# Patient Record
Sex: Female | Born: 1970 | Race: Black or African American | Hispanic: No | Marital: Single | Smoking: Current every day smoker
Health system: Southern US, Community
[De-identification: ages and names within clinical notes are randomized; demographics above are authoritative.]

## PROBLEM LIST (undated history)

## (undated) DIAGNOSIS — I10 Essential (primary) hypertension: Secondary | ICD-10-CM

## (undated) DIAGNOSIS — R51 Headache: Secondary | ICD-10-CM

## (undated) DIAGNOSIS — F329 Major depressive disorder, single episode, unspecified: Secondary | ICD-10-CM

## (undated) DIAGNOSIS — F419 Anxiety disorder, unspecified: Secondary | ICD-10-CM

## (undated) DIAGNOSIS — F32A Depression, unspecified: Secondary | ICD-10-CM

## (undated) DIAGNOSIS — R519 Headache, unspecified: Secondary | ICD-10-CM

## (undated) DIAGNOSIS — R42 Dizziness and giddiness: Secondary | ICD-10-CM

## (undated) HISTORY — DX: Headache: R51

## (undated) HISTORY — PX: SMALL INTESTINE SURGERY: SHX150

## (undated) HISTORY — DX: Headache, unspecified: R51.9

---

## 2004-02-17 ENCOUNTER — Emergency Department (HOSPITAL_COMMUNITY): Admission: EM | Admit: 2004-02-17 | Discharge: 2004-02-17 | Payer: Self-pay | Admitting: Emergency Medicine

## 2004-11-25 ENCOUNTER — Emergency Department (HOSPITAL_COMMUNITY): Admission: EM | Admit: 2004-11-25 | Discharge: 2004-11-25 | Payer: Self-pay | Admitting: Emergency Medicine

## 2011-03-01 ENCOUNTER — Emergency Department (HOSPITAL_COMMUNITY)
Admission: EM | Admit: 2011-03-01 | Discharge: 2011-03-01 | Disposition: A | Discharge status: Home / Self Care | Payer: Medicare Other | Attending: Emergency Medicine | Admitting: Emergency Medicine

## 2011-03-01 ENCOUNTER — Encounter: Payer: Self-pay | Admitting: *Deleted

## 2011-03-01 ENCOUNTER — Emergency Department (HOSPITAL_COMMUNITY): Payer: Medicare Other

## 2011-03-01 DIAGNOSIS — R296 Repeated falls: Secondary | ICD-10-CM | POA: Insufficient documentation

## 2011-03-01 DIAGNOSIS — I10 Essential (primary) hypertension: Secondary | ICD-10-CM | POA: Insufficient documentation

## 2011-03-01 DIAGNOSIS — M7989 Other specified soft tissue disorders: Secondary | ICD-10-CM

## 2011-03-01 DIAGNOSIS — S82302A Unspecified fracture of lower end of left tibia, initial encounter for closed fracture: Secondary | ICD-10-CM

## 2011-03-01 DIAGNOSIS — S8253XA Displaced fracture of medial malleolus of unspecified tibia, initial encounter for closed fracture: Secondary | ICD-10-CM | POA: Insufficient documentation

## 2011-03-01 DIAGNOSIS — F17200 Nicotine dependence, unspecified, uncomplicated: Secondary | ICD-10-CM | POA: Insufficient documentation

## 2011-03-01 HISTORY — DX: Anxiety disorder, unspecified: F41.9

## 2011-03-01 HISTORY — DX: Major depressive disorder, single episode, unspecified: F32.9

## 2011-03-01 HISTORY — DX: Depression, unspecified: F32.A

## 2011-03-01 HISTORY — DX: Essential (primary) hypertension: I10

## 2011-03-01 HISTORY — DX: Dizziness and giddiness: R42

## 2011-03-01 LAB — COMPREHENSIVE METABOLIC PANEL
ALT: 13 U/L (ref 0–35)
Albumin: 3.4 g/dL — ABNORMAL LOW (ref 3.5–5.2)
BUN: 7 mg/dL (ref 6–23)
Calcium: 8.7 mg/dL (ref 8.4–10.5)
GFR calc Af Amer: >60 mL/min (ref >60)
Glucose, Bld: 96 mg/dL (ref 70–99)
Sodium: 138 mEq/L (ref 135–145)
Total Protein: 7.2 g/dL (ref 6.0–8.3)

## 2011-03-01 LAB — URINALYSIS, ROUTINE W REFLEX MICROSCOPIC
Nitrite: NEGATIVE
Specific Gravity, Urine: 1.02 (ref 1.005–1.030)
pH: 6.5 (ref 5.0–8.0)

## 2011-03-01 LAB — URINE MICROSCOPIC-ADD ON

## 2011-03-01 MED ORDER — KETOROLAC TROMETHAMINE 60 MG/2ML IM SOLN
60.0000 mg | Freq: Once | INTRAMUSCULAR | Status: AC
  Administered 2011-03-01: 60 mg via INTRAMUSCULAR
  Filled 2011-03-01: qty 2

## 2011-03-01 MED ORDER — IBUPROFEN 800 MG PO TABS: 800.0000 mg | ORAL_TABLET | Freq: Three times a day (TID) | ORAL | Status: AC

## 2011-03-01 MED ORDER — ONDANSETRON 4 MG PO TBDP
ORAL_TABLET | ORAL | Status: AC
  Administered 2011-03-01: 4 mg
  Filled 2011-03-01: qty 1

## 2011-03-01 MED ORDER — KETOROLAC TROMETHAMINE 30 MG/ML IJ SOLN: 30.0000 mg | Freq: Once | INTRAMUSCULAR | Status: DC

## 2011-03-01 MED ORDER — ONDANSETRON 4 MG PO TBDP: 4.0000 mg | ORAL_TABLET | Freq: Three times a day (TID) | ORAL | Status: AC | PRN

## 2011-03-01 NOTE — ED Provider Notes (Signed)
History     CSN: [id number] Arrival date & time: 03/01/2011  6:11 PM  Chief Complaint  Patient presents with  . Foot Swelling   HPI Comments: Patient presents to the emergency department as a 40 year old female with a history of hypertension with 2 weeks of ankle swelling. She states that this was gradual in onset and gradually worsened and last night fell when she twisted her left ankle. Her left ankle has thus been more swollen and tender since that time. She denies shortness of breath, dyspnea with exertion, chest pain, abdominal pain or swelling of any other part of her body. The symptoms are gradually getting worse, nothing makes better, worse with trauma. He has no fevers or coughs. She has no history of congestive heart failure, kidney or liver failure. She denies excessive alcohol use or excessive Tylenol use. She states that her family Dr. has recently discontinued her blood pressure medication and she is unsure why.  The history is provided by the patient.    Past Medical History  Diagnosis Date  . Hypertension   . Depression   . Anxiety   . Vertigo     Past Surgical History  Procedure Date  . Small intestine surgery     No family history on file.  History  Substance Use Topics  . Smoking status: Current Everyday Smoker  . Smokeless tobacco: Not on file  . Alcohol Use: No    OB History    Grav Para Term Preterm Abortions TAB SAB Ect Mult Living                  Review of Systems  All other systems reviewed and are negative.    Physical Exam  BP 122/86  Pulse 85  Temp(Src) 98.3 F (36.8 C) (Oral)  Resp 20  Ht 5\' 3"  (1.6 m)  Wt 180 lb 7 oz (81.846 kg)  BMI 31.96 kg/m2  SpO2 100%  LMP 03/01/2011  Physical Exam  Nursing note and vitals reviewed. Constitutional: She appears well-developed and well-nourished. No distress.  HENT:  Head: Normocephalic and atraumatic.  Mouth/Throat: Oropharynx is clear and moist. No oropharyngeal exudate.  Eyes:  Conjunctivae and EOM are normal. Pupils are equal, round, and reactive to light. Right eye exhibits no discharge. Left eye exhibits no discharge. No scleral icterus.  Neck: Normal range of motion. Neck supple. No JVD present. No thyromegaly present.  Cardiovascular: Normal rate, regular rhythm, normal heart sounds and intact distal pulses.  Exam reveals no gallop and no friction rub.   No murmur heard. Pulmonary/Chest: Effort normal and breath sounds normal. No respiratory distress. She has no wheezes. She has no rales.  Abdominal: Soft. Bowel sounds are normal. She exhibits no distension and no mass. There is no tenderness.  Musculoskeletal: Normal range of motion. She exhibits edema (mild right lower extremity edema, significant left lower extremity edema) and tenderness (Tender to palpation over the left ankle with decreased range of motion secondary to tenderness.).  Lymphadenopathy:    She has no cervical adenopathy.  Neurological: She is alert. Coordination normal.  Skin: Skin is warm and dry. No rash noted. No erythema.  Psychiatric: She has a normal mood and affect. Her behavior is normal.    ED Course  SPLINT APPLICATION Date/Time: 03/01/2011 7:46 PM Performed by: Eber Hong D Authorized by: Eber Hong D Consent: Verbal consent obtained. Consent given by: patient Imaging studies: imaging studies available Patient identity confirmed: verbally with patient Splint type: ankle stirrup Supplies used:  Ortho-Glass, cotton padding and elastic bandage Post-procedure: The splinted body part was neurovascularly unchanged following the procedure. Patient tolerance: Patient tolerated the procedure well with no immediate complications.    MDM Patient has bilateral lower extremity edema left greater than right however she has injured this left ankle as of last night. According to the x-ray report she has a nondisplaced fracture of the medial malleolus of the left ankle. Will immobilize  ice and elevate and have her followup with orthopedics. Also rule out other sources of swelling such as kidney disease with proteinuria and hypoalbuminemia. Due to the bilateral edema of the lower extremities and no other significant risk factors for DVT we'll forego this testing today. She can followup with her family doctor if she continues to have swelling despite a source. I have communicated these findings with the patient and agrees to follow up as indicated.    X-rays reviewed, splint placed, laboratory workup reveals no source for her swelling. Advised patient on further workup if swelling doesn't go down. Fracture explains excessive swelling of the left ankle.  Vida Roller, MD 03/01/11 2020

## 2011-03-01 NOTE — ED Notes (Signed)
Patient now states she has diarrhea

## 2011-03-01 NOTE — ED Notes (Signed)
C/o swelling to bil feet/ankles x 1 wk.  States fell x 2 days ago and hurt left ankle.  C/o pain to both ankles.

## 2011-03-01 NOTE — ED Notes (Signed)
C/o bil ankle pain. Slight swelling noted.

## 2012-02-29 ENCOUNTER — Emergency Department (HOSPITAL_COMMUNITY)
Admission: EM | Admit: 2012-02-29 | Discharge: 2012-02-29 | Disposition: A | Discharge status: Home / Self Care | Payer: Medicare Other | Attending: Emergency Medicine | Admitting: Emergency Medicine

## 2012-02-29 ENCOUNTER — Encounter (HOSPITAL_COMMUNITY): Payer: Self-pay | Admitting: Emergency Medicine

## 2012-02-29 DIAGNOSIS — F17200 Nicotine dependence, unspecified, uncomplicated: Secondary | ICD-10-CM | POA: Insufficient documentation

## 2012-02-29 DIAGNOSIS — Z760 Encounter for issue of repeat prescription: Secondary | ICD-10-CM | POA: Insufficient documentation

## 2012-02-29 DIAGNOSIS — R51 Headache: Secondary | ICD-10-CM | POA: Insufficient documentation

## 2012-02-29 DIAGNOSIS — I10 Essential (primary) hypertension: Secondary | ICD-10-CM | POA: Insufficient documentation

## 2012-02-29 MED ORDER — ALPRAZOLAM 1 MG PO TABS: ORAL_TABLET | ORAL | Status: DC

## 2012-02-29 NOTE — ED Notes (Signed)
Pt is tearful and anxious, states that she is seen at a mental health place but lost her prescription for xanax and her MD would not re-write it resulting in her being out of this medication and increased anxiety from this.  Pt has a HA which she attributes to stress which she has had "all her life".  Pt tearfully describes multiple life events which have been upsetting to her (none are very recent).  Pt denies any SI or HI.  NO neuro deficits from this HA

## 2012-02-29 NOTE — ED Notes (Signed)
[facility] contacted and list of home medications requested. Pharmacy to send copy as soon as possible.

## 2012-02-29 NOTE — ED Provider Notes (Signed)
Medical screening examination/treatment/procedure(s) were performed by non-physician practitioner and as supervising physician I was immediately available for consultation/collaboration.  Cierrah Dace, MD 02/29/12 1520 
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
## 2012-02-29 NOTE — ED Notes (Signed)
Pt c/o headache all her life. Pt was seen by PCP and [facility] for the same and they would not give her anything for pain.

## 2013-05-17 ENCOUNTER — Other Ambulatory Visit: Payer: Self-pay

## 2013-05-17 DIAGNOSIS — G4730 Sleep apnea, unspecified: Secondary | ICD-10-CM

## 2014-06-02 ENCOUNTER — Ambulatory Visit (INDEPENDENT_AMBULATORY_CARE_PROVIDER_SITE_OTHER): Payer: Self-pay | Admitting: Psychiatry

## 2014-06-02 ENCOUNTER — Emergency Department (HOSPITAL_COMMUNITY)
Admission: EM | Admit: 2014-06-02 | Discharge: 2014-06-02 | Disposition: A | Discharge status: Home / Self Care | Payer: Medicare Other | Attending: Emergency Medicine | Admitting: Emergency Medicine

## 2014-06-02 ENCOUNTER — Encounter (HOSPITAL_COMMUNITY): Payer: Self-pay | Admitting: Psychiatry

## 2014-06-02 ENCOUNTER — Encounter (HOSPITAL_COMMUNITY): Payer: Self-pay | Admitting: *Deleted

## 2014-06-02 VITALS — BP 106/80 | HR 86 | Ht 63.0 in | Wt 190.0 lb

## 2014-06-02 DIAGNOSIS — R51 Headache: Secondary | ICD-10-CM | POA: Diagnosis present

## 2014-06-02 DIAGNOSIS — K006 Disturbances in tooth eruption: Secondary | ICD-10-CM | POA: Insufficient documentation

## 2014-06-02 DIAGNOSIS — R110 Nausea: Secondary | ICD-10-CM | POA: Insufficient documentation

## 2014-06-02 DIAGNOSIS — I10 Essential (primary) hypertension: Secondary | ICD-10-CM | POA: Diagnosis not present

## 2014-06-02 DIAGNOSIS — F32A Depression, unspecified: Secondary | ICD-10-CM | POA: Insufficient documentation

## 2014-06-02 DIAGNOSIS — K0381 Cracked tooth: Secondary | ICD-10-CM | POA: Diagnosis not present

## 2014-06-02 DIAGNOSIS — Z720 Tobacco use: Secondary | ICD-10-CM | POA: Insufficient documentation

## 2014-06-02 DIAGNOSIS — F419 Anxiety disorder, unspecified: Secondary | ICD-10-CM | POA: Diagnosis not present

## 2014-06-02 DIAGNOSIS — F329 Major depressive disorder, single episode, unspecified: Secondary | ICD-10-CM | POA: Diagnosis not present

## 2014-06-02 DIAGNOSIS — R519 Headache, unspecified: Secondary | ICD-10-CM

## 2014-06-02 DIAGNOSIS — Z79899 Other long term (current) drug therapy: Secondary | ICD-10-CM | POA: Insufficient documentation

## 2014-06-02 MED ORDER — DIPHENHYDRAMINE HCL 50 MG/ML IJ SOLN
25.0000 mg | Freq: Once | INTRAMUSCULAR | Status: AC
  Administered 2014-06-02: 25 mg via INTRAMUSCULAR
  Filled 2014-06-02: qty 1

## 2014-06-02 MED ORDER — FLUOXETINE HCL 40 MG PO CAPS: 40.0000 mg | ORAL_CAPSULE | Freq: Every day | ORAL | Status: DC

## 2014-06-02 MED ORDER — KETOROLAC TROMETHAMINE 60 MG/2ML IM SOLN
60.0000 mg | Freq: Once | INTRAMUSCULAR | Status: AC
  Administered 2014-06-02: 60 mg via INTRAMUSCULAR
  Filled 2014-06-02: qty 2

## 2014-06-02 MED ORDER — METOCLOPRAMIDE HCL 5 MG/ML IJ SOLN
10.0000 mg | Freq: Once | INTRAMUSCULAR | Status: AC
  Administered 2014-06-02: 10 mg via INTRAMUSCULAR
  Filled 2014-06-02: qty 2

## 2014-06-02 MED ORDER — TRAZODONE HCL 100 MG PO TABS: 100.0000 mg | ORAL_TABLET | Freq: Every day | ORAL | Status: DC

## 2014-06-02 NOTE — ED Provider Notes (Signed)
CSN: [id number]     Arrival date & time 06/02/14  1118 History   First MD Initiated Contact with Patient 06/02/14 1204     Chief Complaint  Patient presents with  . Headache     (Consider location/radiation/quality/duration/timing/severity/associated sxs/prior Treatment) HPI Comments: Pt comes in with headache times a couple of day. Pt has history of headache. She has taken multiple otc medications without relief. Has had nausea no vomiting. Pt states that she thinks it was triggered from her toothache and having her period. Denies blurred vision, headache, syncope or neck pain. Pt is on pcn for toothache  The history is provided by the patient. No language interpreter was used.    Past Medical History  Diagnosis Date  . Hypertension   . Depression   . Anxiety   . Vertigo   . Headache    Past Surgical History  Procedure Laterality Date  . Small intestine surgery     History reviewed. No pertinent family history. History  Substance Use Topics  . Smoking status: Current Every Day Smoker  . Smokeless tobacco: Not on file  . Alcohol Use: Yes     Comment: Occa.   OB History    No data available     Review of Systems  All other systems reviewed and are negative.     Allergies  Review of patient's allergies indicates no known allergies.  Home Medications   Prior to Admission medications   Medication Sig Start Date End Date Taking? Authorizing Provider  clonazePAM (KLONOPIN) 0.5 MG tablet Take 1 tablet by mouth at bedtime as needed for anxiety.  05/24/14  Yes Historical Provider, MD  FLUoxetine (PROZAC) 40 MG capsule Take 1 capsule (40 mg total) by mouth daily. 06/02/14  Yes Levonne Spiller, MD  HYDROcodone-acetaminophen (NORCO/VICODIN) 5-325 MG per tablet Take 1 tablet by mouth 2 (two) times daily as needed for moderate pain.  05/25/14  Yes Historical Provider, MD  ibuprofen (ADVIL,MOTRIN) 200 MG tablet Take 400 mg by mouth daily as needed. For pain   Yes Historical  Provider, MD  meclizine (ANTIVERT) 25 MG tablet Take 25 mg by mouth 3 (three) times daily as needed for dizziness.  05/24/14  Yes Historical Provider, MD  ondansetron (ZOFRAN) 4 MG tablet Take 4 mg by mouth every 8 (eight) hours as needed for nausea or vomiting.  05/30/14  Yes Historical Provider, MD  risperiDONE (RISPERDAL) 1 MG tablet Take 1 tablet by mouth at bedtime. 05/20/14  Yes Historical Provider, MD  traZODone (DESYREL) 100 MG tablet Take 1 tablet (100 mg total) by mouth at bedtime. 06/02/14  Yes Levonne Spiller, MD  cyclobenzaprine (FLEXERIL) 10 MG tablet Take 10 mg by mouth 3 (three) times daily as needed. For muscles. Filled July 26th    Historical Provider, MD   BP 105/74 mmHg  Pulse 79  Temp(Src) 98.1 F (36.7 C) (Oral)  Resp 18  Ht 5\' 3"  (1.6 m)  Wt 198 lb (89.812 kg)  BMI 35.08 kg/m2  SpO2 100%  LMP 05/29/2014 Physical Exam  Constitutional: She is oriented to person, place, and time. She appears well-developed and well-nourished.  HENT:  Right Ear: External ear normal.  Left Ear: External ear normal.  Multiple decayed and broken teeth  Eyes: Conjunctivae and EOM are normal. Pupils are equal, round, and reactive to light.  Neck: Normal range of motion. Neck supple.  Cardiovascular: Normal rate and regular rhythm.   Pulmonary/Chest: Effort normal and breath sounds normal.  Musculoskeletal: Normal range of  motion.  Neurological: She is oriented to person, place, and time.  Skin: Skin is warm and dry.  Psychiatric: She has a normal mood and affect.  Nursing note and vitals reviewed.   ED Course  Procedures (including critical care time) Labs Review Labs Reviewed - No data to display  Imaging Review No results found.   EKG Interpretation None      MDM   Final diagnoses:  Headache, unspecified headache type    Pt is feeling better after medication. No red flag symptoms    Glendell Docker, NP 06/02/14 1349  Ezequiel Essex, MD 06/02/14 1505

## 2014-06-02 NOTE — ED Notes (Signed)
Patient received Tordol, unable to scan because the computer in room 23 froze and has the medication locked and will not let me document on it.

## 2014-06-02 NOTE — ED Notes (Signed)
Headache for 2 days, with dental pain ,rt lower tooth for "long time"  No HI alert,  Nausea, no vomiting.

## 2014-06-02 NOTE — Care Management Note (Signed)
CM noted pt has no PCP. Patient states her PCP is Dr Woody Seller in [place]. Information entered into EPIC to show PCP as Dr Woody Seller.
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
## 2014-06-02 NOTE — Discharge Instructions (Signed)

## 2014-06-20 ENCOUNTER — Telehealth (HOSPITAL_COMMUNITY): Payer: Self-pay | Admitting: *Deleted

## 2014-06-21 ENCOUNTER — Telehealth (HOSPITAL_COMMUNITY): Payer: Self-pay | Admitting: *Deleted

## 2014-06-21 ENCOUNTER — Other Ambulatory Visit (HOSPITAL_COMMUNITY): Payer: Self-pay | Admitting: Psychiatry

## 2014-06-21 NOTE — Telephone Encounter (Signed)
She obtained that medication from another MD. Let her know I can't refill it

## 2014-06-21 NOTE — Telephone Encounter (Signed)
Pt calling stating she need refills for her Clonazapam 0.5mg  BID. Pt states was last seen 06/02/14. Medication was not refilled at that time. Pt have f/u appt scheduled for 06-30-14. Called pt pharmacy and spoke with [facility] and she stated pt pick up last refill 05-24-14 but the sig was different. The script pt picked up was 05-24-14 was written for once a day not twice a day. Pt phone number is [phone].

## 2014-06-21 NOTE — Telephone Encounter (Signed)
She will not be able to obtain controlled drugs from this office as her drug screen shows evidence of xanax, temazepam(not prescribed) and cocaine. Please make her aware of this

## 2014-06-21 NOTE — Telephone Encounter (Signed)
Per pt she do not see that doctor anymore since she started coming to see Dr. Harrington Challenger. Per pt, Dr. Jonne Ply (From [facility]) informed her he will only give her enough until her appt with Dr. Harrington Challenger which was 06-02-14 . Pt number is [phone].

## 2014-06-22 NOTE — Telephone Encounter (Signed)
Called pt (06-21-14) to inform her of Dr. Harrington Challenger decision. Per pt she takes Xanax and Dr. Harrington Challenger knows about and and she do not take Temazepam and do not know why her urine test came back stating she takes Temazepam. Per pt she do not use crack and uses marijuana every now and then but not crack. Asked pt if she informed Dr. Harrington Challenger about her being on Xanax and pt states she told Dr. Harrington Challenger about it before. Pt later stated she if Dr. Harrington Challenger is unable to refill her medications she will not be coming back to see her anymore. Offered pt if she would like to speak with Dr. Harrington Challenger about her Urine test and pt agreed to talk to her. After pt conversation with Dr. Harrington Challenger, per Dr. Harrington Challenger, pt informed her that she will no longer be coming to our office and voluntarily discharged herself from practice.

## 2014-06-27 ENCOUNTER — Encounter (HOSPITAL_COMMUNITY): Payer: Self-pay | Admitting: *Deleted

## 2014-06-30 ENCOUNTER — Ambulatory Visit (HOSPITAL_COMMUNITY): Payer: Self-pay | Admitting: Psychiatry

## 2014-07-06 ENCOUNTER — Ambulatory Visit (HOSPITAL_COMMUNITY): Payer: Self-pay | Admitting: Psychiatry

## 2014-08-08 ENCOUNTER — Other Ambulatory Visit (HOSPITAL_COMMUNITY): Payer: Self-pay | Admitting: Respiratory Therapy

## 2014-08-08 DIAGNOSIS — G4730 Sleep apnea, unspecified: Secondary | ICD-10-CM

## 2014-10-19 ENCOUNTER — Encounter: Payer: Self-pay | Admitting: Psychiatry

## 2015-01-27 ENCOUNTER — Encounter (HOSPITAL_COMMUNITY): Payer: Self-pay | Admitting: Emergency Medicine

## 2015-01-27 ENCOUNTER — Emergency Department (HOSPITAL_COMMUNITY): Payer: Medicare Other

## 2015-01-27 ENCOUNTER — Emergency Department (HOSPITAL_COMMUNITY)
Admission: EM | Admit: 2015-01-27 | Discharge: 2015-01-27 | Disposition: A | Discharge status: Home / Self Care | Payer: Medicare Other | Attending: Emergency Medicine | Admitting: Emergency Medicine

## 2015-01-27 DIAGNOSIS — Z79899 Other long term (current) drug therapy: Secondary | ICD-10-CM | POA: Diagnosis not present

## 2015-01-27 DIAGNOSIS — F419 Anxiety disorder, unspecified: Secondary | ICD-10-CM | POA: Diagnosis not present

## 2015-01-27 DIAGNOSIS — I10 Essential (primary) hypertension: Secondary | ICD-10-CM | POA: Diagnosis not present

## 2015-01-27 DIAGNOSIS — S1093XA Contusion of unspecified part of neck, initial encounter: Secondary | ICD-10-CM | POA: Diagnosis not present

## 2015-01-27 DIAGNOSIS — Y9389 Activity, other specified: Secondary | ICD-10-CM | POA: Insufficient documentation

## 2015-01-27 DIAGNOSIS — F329 Major depressive disorder, single episode, unspecified: Secondary | ICD-10-CM | POA: Diagnosis not present

## 2015-01-27 DIAGNOSIS — Y92002 Bathroom of unspecified non-institutional (private) residence single-family (private) house as the place of occurrence of the external cause: Secondary | ICD-10-CM | POA: Insufficient documentation

## 2015-01-27 DIAGNOSIS — S39012A Strain of muscle, fascia and tendon of lower back, initial encounter: Secondary | ICD-10-CM | POA: Diagnosis not present

## 2015-01-27 DIAGNOSIS — S3992XA Unspecified injury of lower back, initial encounter: Secondary | ICD-10-CM | POA: Diagnosis present

## 2015-01-27 DIAGNOSIS — W16212A Fall in (into) filled bathtub causing other injury, initial encounter: Secondary | ICD-10-CM | POA: Insufficient documentation

## 2015-01-27 DIAGNOSIS — Y998 Other external cause status: Secondary | ICD-10-CM | POA: Diagnosis not present

## 2015-01-27 MED ORDER — HYDROCODONE-ACETAMINOPHEN 5-325 MG PO TABS: 2.0000 | ORAL_TABLET | ORAL | Status: DC | PRN

## 2015-01-27 MED ORDER — CYCLOBENZAPRINE HCL 10 MG PO TABS: 10.0000 mg | ORAL_TABLET | Freq: Three times a day (TID) | ORAL | Status: DC | PRN

## 2015-01-27 NOTE — Discharge Instructions (Signed)
Back Pain, Adult °Low back pain is very common. About 1 in 5 people have back pain. The cause of low back pain is rarely dangerous. The pain often gets better over time. About half of people with a sudden onset of back pain feel better in just 2 weeks. About 8 in 10 people feel better by 6 weeks.  °CAUSES °Some common causes of back pain include: °· Strain of the muscles or ligaments supporting the spine. °· Wear and tear (degeneration) of the spinal discs. °· Arthritis. °· Direct injury to the back. °DIAGNOSIS °Most of the time, the direct cause of low back pain is not known. However, back pain can be treated effectively even when the exact cause of the pain is unknown. Answering your caregiver's questions about your overall health and symptoms is one of the most accurate ways to make sure the cause of your pain is not dangerous. If your caregiver needs more information, he or she may order lab work or imaging tests (X-rays or MRIs). However, even if imaging tests show changes in your back, this usually does not require surgery. °HOME CARE INSTRUCTIONS °For many people, back pain returns. Since low back pain is rarely dangerous, it is often a condition that people can learn to manage on their own.  °· Remain active. It is stressful on the back to sit or stand in one place. Do not sit, drive, or stand in one place for more than 30 minutes at a time. Take short walks on level surfaces as soon as pain allows. Try to increase the length of time you walk each day. °· Do not stay in bed. Resting more than 1 or 2 days can delay your recovery. °· Do not avoid exercise or work. Your body is made to move. It is not dangerous to be active, even though your back may hurt. Your back will likely heal faster if you return to being active before your pain is gone. °· Pay attention to your body when you  bend and lift. Many people have less discomfort when lifting if they bend their knees, keep the load close to their bodies, and  avoid twisting. Often, the most comfortable positions are those that put less stress on your recovering back. °· Find a comfortable position to sleep. Use a firm mattress and lie on your side with your knees slightly bent. If you lie on your back, put a pillow under your knees. °· Only take over-the-counter or prescription medicines as directed by your caregiver. Over-the-counter medicines to reduce pain and inflammation are often the most helpful. Your caregiver may prescribe muscle relaxant drugs. These medicines help dull your pain so you can more quickly return to your normal activities and healthy exercise. °· Put ice on the injured area. °¨ Put ice in a plastic bag. °¨ Place a towel between your skin and the bag. °¨ Leave the ice on for 15-20 minutes, 03-04 times a day for the first 2 to 3 days. After that, ice and heat may be alternated to reduce pain and spasms. °· Ask your caregiver about trying back exercises and gentle massage. This may be of some benefit. °· Avoid feeling anxious or stressed. Stress increases muscle tension and can worsen back pain. It is important to recognize when you are anxious or stressed and learn ways to manage it. Exercise is a great option. °SEEK MEDICAL CARE IF: °· You have pain that is not relieved with rest or medicine. °· You have pain that does not improve in 1 week. °· You have new symptoms. °· You are generally not feeling well. °SEEK   IMMEDIATE MEDICAL CARE IF:   You have pain that radiates from your back into your legs.  You develop new bowel or bladder control problems.  You have unusual weakness or numbness in your arms or legs.  You develop nausea or vomiting.  You develop abdominal pain.  You feel faint. Document Released: 07/08/2005 Document Revised: 01/07/2012 Document Reviewed: 11/09/2013 [facility] Patient Information 2015 [place], [place]. This information is not intended to replace advice given to you by your health care provider. Make sure you  discuss any questions you have with your health care provider. Fall Prevention and Home Safety Falls cause injuries and can affect all age groups. It is possible to use preventive measures to significantly decrease the likelihood of falls. There are many simple measures which can make your home safer and prevent falls. OUTDOORS  Repair cracks and edges of walkways and driveways.  Remove high doorway thresholds.  Trim shrubbery on the main path into your home.  Have good outside lighting.  Clear walkways of tools, rocks, debris, and clutter.  Check that handrails are not broken and are securely fastened. Both sides of steps should have handrails.  Have leaves, snow, and ice cleared regularly.  Use sand or salt on walkways during winter months.  In the garage, clean up grease or oil spills. BATHROOM  Install night lights.  Install grab bars by the toilet and in the tub and shower.  Use non-skid mats or decals in the tub or shower.  Place a plastic non-slip stool in the shower to sit on, if needed.  Keep floors dry and clean up all water on the floor immediately.  Remove soap buildup in the tub or shower on a regular basis.  Secure bath mats with non-slip, double-sided rug tape.  Remove throw rugs and tripping hazards from the floors. BEDROOMS  Install night lights.  Make sure a bedside light is easy to reach.  Do not use oversized bedding.  Keep a telephone by your bedside.  Have a firm chair with side arms to use for getting dressed.  Remove throw rugs and tripping hazards from the floor. KITCHEN  Keep handles on pots and pans turned toward the center of the stove. Use back burners when possible.  Clean up spills quickly and allow time for drying.  Avoid walking on wet floors.  Avoid hot utensils and knives.  Position shelves so they are not too high or low.  Place commonly used objects within easy reach.  If necessary, use a sturdy step stool with a  grab bar when reaching.  Keep electrical cables out of the way.  Do not use floor polish or wax that makes floors slippery. If you must use wax, use non-skid floor wax.  Remove throw rugs and tripping hazards from the floor. STAIRWAYS  Never leave objects on stairs.  Place handrails on both sides of stairways and use them. Fix any loose handrails. Make sure handrails on both sides of the stairways are as long as the stairs.  Check carpeting to make sure it is firmly attached along stairs. Make repairs to worn or loose carpet promptly.  Avoid placing throw rugs at the top or bottom of stairways, or properly secure the rug with carpet tape to prevent slippage. Get rid of throw rugs, if possible.  Have an electrician put in a light switch at the top and bottom of the stairs. OTHER FALL PREVENTION TIPS  Wear low-heel or rubber-soled shoes that are supportive and fit well. Wear  closed toe shoes.  When using a stepladder, make sure it is fully opened and both spreaders are firmly locked. Do not climb a closed stepladder.  Add color or contrast paint or tape to grab bars and handrails in your home. Place contrasting color strips on first and last steps.  Learn and use mobility aids as needed. Install an electrical emergency response system.  Turn on lights to avoid dark areas. Replace light bulbs that burn out immediately. Get light switches that glow.  Arrange furniture to create clear pathways. Keep furniture in the same place.  Firmly attach carpet with non-skid or double-sided tape.  Eliminate uneven floor surfaces.  Select a carpet pattern that does not visually hide the edge of steps.  Be aware of all pets. OTHER HOME SAFETY TIPS  Set the water temperature for 120 F (48.8 C).  Keep emergency numbers on or near the telephone.  Keep smoke detectors on every level of the home and near sleeping areas. Document Released: 06/28/2002 Document Revised: 01/07/2012 Document  Reviewed: 09/27/2011 [facility] Patient Information 2015 [place], [place]. This information is not intended to replace advice given to you by your health care provider. Make sure you discuss any questions you have with your health care provider.

## 2015-01-27 NOTE — ED Notes (Signed)
Pt alert & oriented x4, stable gait. Patient given discharge instructions, paperwork & prescription(s). Patient  instructed to stop at the registration desk to finish any additional paperwork. Patient verbalized understanding. Pt left department w/ no further questions. 

## 2015-01-27 NOTE — ED Notes (Signed)
Pt reports slipping and falling in the bathtub this am. Pt states she hit the back of her head and low back. No LOC.

## 2015-01-27 NOTE — ED Provider Notes (Signed)
CSN: [id number]     Arrival date & time 01/27/15  0815 History  This chart was scribed for non-physician practitioner Alyse Low, PA, working with Virgel Manifold, MD, by Eustaquio Maize, ED Scribe. This patient was seen in room APA03/APA03 and the patient's care was started at 8:43 AM.  Chief Complaint  Patient presents with  . Fall   Patient is a 44 y.o. female presenting with fall. The history is provided by the patient. No language interpreter was used.  Fall This is a new problem. The current episode started 1 to 2 hours ago. The problem occurs rarely. The problem has not changed since onset.Pertinent negatives include no chest pain, no abdominal pain, no headaches and no shortness of breath. Nothing aggravates the symptoms. Nothing relieves the symptoms. She has tried nothing for the symptoms.     HPI Comments: Shannon Clements is a 44 y.o. female who presents to the Emergency Department complaining of lower back pain s/p ground level fall that occurred this morning. Pt states that she was getting into the bathtub when she slipped and fell. Pt states that she hits the back of her head on the tub but denies LOC. She is also complaining of right sided pain from the fall. Pt is current everyday. She denies any other associated symptoms.    Past Medical History  Diagnosis Date  . Hypertension   . Depression   . Anxiety   . Vertigo   . Headache    Past Surgical History  Procedure Laterality Date  . Small intestine surgery     Family History  Problem Relation Age of Onset  . Cancer Father    History  Substance Use Topics  . Smoking status: Current Every Day Smoker -- 0.50 packs/day    Types: Cigarettes  . Smokeless tobacco: Never Used  . Alcohol Use: No     Comment: Occa.   OB History    No data available     Review of Systems  Respiratory: Negative for shortness of breath.   Cardiovascular: Negative for chest pain.  Gastrointestinal: Negative for abdominal pain.   Musculoskeletal: Positive for back pain. Negative for gait problem.  Neurological: Negative for syncope and headaches.  All other systems reviewed and are negative.     Allergies  Tramadol  Home Medications   Prior to Admission medications   Medication Sig Start Date End Date Taking? Authorizing Provider  clonazePAM (KLONOPIN) 0.5 MG tablet Take 1 tablet by mouth at bedtime as needed for anxiety.  05/24/14   Historical Provider, MD  cyclobenzaprine (FLEXERIL) 10 MG tablet Take 10 mg by mouth 3 (three) times daily as needed. For muscles. Filled July 26th    Historical Provider, MD  FLUoxetine (PROZAC) 40 MG capsule Take 1 capsule (40 mg total) by mouth daily. 06/02/14   Cloria Spring, MD  HYDROcodone-acetaminophen (NORCO/VICODIN) 5-325 MG per tablet Take 1 tablet by mouth 2 (two) times daily as needed for moderate pain.  05/25/14   Historical Provider, MD  ibuprofen (ADVIL,MOTRIN) 200 MG tablet Take 400 mg by mouth daily as needed. For pain    Historical Provider, MD  meclizine (ANTIVERT) 25 MG tablet Take 25 mg by mouth 3 (three) times daily as needed for dizziness.  05/24/14   Historical Provider, MD  ondansetron (ZOFRAN) 4 MG tablet Take 4 mg by mouth every 8 (eight) hours as needed for nausea or vomiting.  05/30/14   Historical Provider, MD  risperiDONE (RISPERDAL) 1 MG tablet Take  1 tablet by mouth at bedtime. 05/20/14   Historical Provider, MD  traZODone (DESYREL) 100 MG tablet Take 1 tablet (100 mg total) by mouth at bedtime. 06/02/14   Cloria Spring, MD   Triage Vitals: BP 118/70 mmHg  Pulse 82  Temp(Src) 97.8 F (36.6 C) (Oral)  Resp 18  Ht 5\' 3"  (1.6 m)  Wt 189 lb (85.73 kg)  BMI 33.49 kg/m2  SpO2 100%  LMP 12/31/2014   Physical Exam  Constitutional: She is oriented to person, place, and time. She appears well-developed and well-nourished. No distress.  HENT:  Head: Normocephalic and atraumatic.  Eyes: Conjunctivae and EOM are normal.  Neck: Normal range of motion.  Neck supple. No tracheal deviation present.  Cardiovascular: Normal rate and regular rhythm.   Pulmonary/Chest: Effort normal. No respiratory distress.  Abdominal: Soft.  Musculoskeletal: Normal range of motion. She exhibits tenderness.  Diffuse tender cervical spine Tender lumbar spine   Neurological: She is alert and oriented to person, place, and time.  Skin: Skin is warm and dry.  Psychiatric: She has a normal mood and affect. Her behavior is normal.  Nursing note and vitals reviewed.   ED Course  Procedures (including critical care time)  DIAGNOSTIC STUDIES: Oxygen Saturation is 100% on RA, normal by my interpretation.    COORDINATION OF CARE: 8:47 AM-Discussed treatment plan which includes DG C Spine with pt at bedside and pt agreed to plan.   Labs Review Labs Reviewed - No data to display  Imaging Review Dg Cervical Spine Complete  01/27/2015   CLINICAL DATA:  Posterior neck pain after slipping and falling today. Initial encounter.  EXAM: CERVICAL SPINE  4+ VIEWS  COMPARISON:  None.  FINDINGS: The prevertebral soft tissues are normal. The alignment is anatomic through T1. There is no evidence of acute fracture or traumatic subluxation. The C1-2 articulation appears normal in the AP projection. The disc spaces are preserved. There are anterior osteophytes at C5-6. Oblique views demonstrate no significant osseous foraminal stenosis.  IMPRESSION: Negative for acute cervical spine fracture, traumatic subluxation or static signs of instability.   Electronically Signed   By: Richardean Sale M.D.   On: 01/27/2015 09:45     EKG Interpretation None      MDM   Final diagnoses:  Contusion of neck, initial encounter  Lumbar strain, initial encounter    Meds ordered this encounter  Medications  . QUEtiapine (SEROQUEL) 300 MG tablet    Sig: Take 600 mg by mouth at bedtime.    Refill:  3  . FLUoxetine (PROZAC) 20 MG capsule    Sig: Take 60 mg by mouth daily.  Marland Kitchen DISCONTD:  HYDROcodone-acetaminophen (NORCO) 7.5-325 MG per tablet    Sig: Take 1 tablet by mouth every 4 (four) hours as needed for moderate pain.  . naproxen sodium (ANAPROX) 220 MG tablet    Sig: Take 440 mg by mouth daily as needed (pain).  Marland Kitchen HYDROcodone-acetaminophen (NORCO/VICODIN) 5-325 MG per tablet    Sig: Take 2 tablets by mouth every 4 (four) hours as needed.    Dispense:  20 tablet    Refill:  0    Order Specific Question:  Supervising Provider    Answer:  MILLER, BRIAN [[id number]]  . cyclobenzaprine (FLEXERIL) 10 MG tablet    Sig: Take 1 tablet (10 mg total) by mouth 3 (three) times daily as needed for muscle spasms. For muscles. Filled July 26th    Dispense:  21 tablet    Refill:  0    Order Specific Question:  Supervising Provider    Answer:  Noemi Chapel [[id number]]    AVS Follow up with your Primary Md for recheck   Fransico Meadow, PA-C 01/27/15 [place], MD 01/30/15 [phone]

## 2015-04-13 ENCOUNTER — Encounter (HOSPITAL_COMMUNITY): Payer: Self-pay | Admitting: *Deleted

## 2015-04-13 ENCOUNTER — Emergency Department (HOSPITAL_COMMUNITY)
Admission: EM | Admit: 2015-04-13 | Discharge: 2015-04-13 | Disposition: A | Discharge status: Home / Self Care | Payer: Medicare Other | Attending: Emergency Medicine | Admitting: Emergency Medicine

## 2015-04-13 DIAGNOSIS — Y9289 Other specified places as the place of occurrence of the external cause: Secondary | ICD-10-CM | POA: Diagnosis not present

## 2015-04-13 DIAGNOSIS — S300XXA Contusion of lower back and pelvis, initial encounter: Secondary | ICD-10-CM

## 2015-04-13 DIAGNOSIS — F329 Major depressive disorder, single episode, unspecified: Secondary | ICD-10-CM | POA: Diagnosis not present

## 2015-04-13 DIAGNOSIS — Y9389 Activity, other specified: Secondary | ICD-10-CM | POA: Insufficient documentation

## 2015-04-13 DIAGNOSIS — Y998 Other external cause status: Secondary | ICD-10-CM | POA: Insufficient documentation

## 2015-04-13 DIAGNOSIS — W182XXA Fall in (into) shower or empty bathtub, initial encounter: Secondary | ICD-10-CM | POA: Diagnosis not present

## 2015-04-13 DIAGNOSIS — I10 Essential (primary) hypertension: Secondary | ICD-10-CM | POA: Diagnosis not present

## 2015-04-13 DIAGNOSIS — Z79899 Other long term (current) drug therapy: Secondary | ICD-10-CM | POA: Insufficient documentation

## 2015-04-13 DIAGNOSIS — Z720 Tobacco use: Secondary | ICD-10-CM | POA: Insufficient documentation

## 2015-04-13 DIAGNOSIS — F419 Anxiety disorder, unspecified: Secondary | ICD-10-CM | POA: Diagnosis not present

## 2015-04-13 DIAGNOSIS — S3992XA Unspecified injury of lower back, initial encounter: Secondary | ICD-10-CM | POA: Diagnosis present

## 2015-04-13 MED ORDER — ONDANSETRON HCL 4 MG PO TABS: 4.0000 mg | ORAL_TABLET | Freq: Four times a day (QID) | ORAL | Status: DC

## 2015-04-13 MED ORDER — HYDROCODONE-ACETAMINOPHEN 5-325 MG PO TABS: ORAL_TABLET | ORAL | Status: DC

## 2015-04-13 MED ORDER — CYCLOBENZAPRINE HCL 10 MG PO TABS: 10.0000 mg | ORAL_TABLET | Freq: Three times a day (TID) | ORAL | Status: DC | PRN

## 2015-04-13 NOTE — ED Notes (Signed)
Pt fell in the bathtub last week and has had lower back pain since then. Pt denies any other symptoms. NAD noted.

## 2015-04-15 NOTE — ED Provider Notes (Signed)
CSN: [id number]     Arrival date & time 04/13/15  1352 History   First MD Initiated Contact with Patient 04/13/15 1452     Chief Complaint  Patient presents with  . Back Pain     (Consider location/radiation/quality/duration/timing/severity/associated sxs/prior Treatment) HPI   Shannon Clements is a 44 y.o. female who presents to the Emergency Department complaining of low back pain for one week after slipping and falling in the bathtub.  She reports continued pain and describes a dull aching pain that is worse with bending and twisting.  Pain improves at rest.  She denies other injuries or LOC.  She also denies numbness, pain or weakness of the lower extremities, fever, urine or bowel changes, and abdominal pain.  She has taken OTC medications for pain without relief.     Past Medical History  Diagnosis Date  . Hypertension   . Depression   . Anxiety   . Vertigo   . Headache    Past Surgical History  Procedure Laterality Date  . Small intestine surgery     Family History  Problem Relation Age of Onset  . Cancer Father    Social History  Substance Use Topics  . Smoking status: Current Every Day Smoker -- 0.50 packs/day    Types: Cigarettes  . Smokeless tobacco: Never Used  . Alcohol Use: No     Comment: Occa.   OB History    No data available     Review of Systems  Constitutional: Negative for fever.  Respiratory: Negative for shortness of breath.   Gastrointestinal: Negative for vomiting, abdominal pain and constipation.  Genitourinary: Negative for dysuria, hematuria, flank pain, decreased urine volume and difficulty urinating.  Musculoskeletal: Positive for back pain. Negative for joint swelling.  Skin: Negative for rash.  Neurological: Negative for weakness and numbness.  All other systems reviewed and are negative.     Allergies  Tramadol  Home Medications   Prior to Admission medications   Medication Sig Start Date End Date Taking? Authorizing  Provider  clonazePAM (KLONOPIN) 0.5 MG tablet Take 1 tablet by mouth daily as needed for anxiety.  05/24/14  Yes Historical Provider, MD  FLUoxetine (PROZAC) 20 MG capsule Take 60 mg by mouth daily.   Yes Historical Provider, MD  meclizine (ANTIVERT) 25 MG tablet Take 25 mg by mouth 3 (three) times daily as needed for dizziness.  05/24/14  Yes Historical Provider, MD  naproxen sodium (ANAPROX) 220 MG tablet Take 440 mg by mouth daily as needed (pain).   Yes Historical Provider, MD  QUEtiapine (SEROQUEL) 300 MG tablet Take 600 mg by mouth at bedtime. 01/05/15  Yes Historical Provider, MD  cyclobenzaprine (FLEXERIL) 10 MG tablet Take 1 tablet (10 mg total) by mouth 3 (three) times daily as needed. 04/13/15   Tammy Triplett, PA-C  HYDROcodone-acetaminophen (NORCO/VICODIN) 5-325 MG per tablet Take one tab po q 4-6 hrs prn pain 04/13/15   Tammy Triplett, PA-C  ondansetron (ZOFRAN) 4 MG tablet Take 1 tablet (4 mg total) by mouth every 6 (six) hours. 04/13/15   Tammy Triplett, PA-C   BP 113/82 mmHg  Pulse 97  Temp(Src) 97.8 F (36.6 C) (Oral)  Resp 16  Ht 5\' 3"  (1.6 m)  Wt 174 lb (78.926 kg)  BMI 30.83 kg/m2  SpO2 100%  LMP 04/10/2015 Physical Exam  Constitutional: She is oriented to person, place, and time. She appears well-developed and well-nourished. No distress.  HENT:  Head: Normocephalic and atraumatic.  Neck: Normal  range of motion. Neck supple.  Cardiovascular: Normal rate, regular rhythm, normal heart sounds and intact distal pulses.   No murmur heard. Pulmonary/Chest: Effort normal and breath sounds normal. No respiratory distress.  Abdominal: Soft. She exhibits no distension. There is no tenderness. There is no rebound and no guarding.  Musculoskeletal: She exhibits tenderness. She exhibits no edema.       Lumbar back: She exhibits tenderness and pain. She exhibits normal range of motion, no swelling, no deformity, no laceration and normal pulse.  Diffuse ttp of the bilateral lumbar  paraspinal muscles.  No spinal tenderness.  DP pulses are brisk and symmetrical.  Distal sensation intact.  Hip Flexors/Extensors are intact.  Pt has 5/5 strength against resistance of bilateral lower extremities.     Neurological: She is alert and oriented to person, place, and time. She has normal strength. No sensory deficit. She exhibits normal muscle tone. Coordination and gait normal.  Reflex Scores:      Patellar reflexes are 2+ on the right side and 2+ on the left side.      Achilles reflexes are 2+ on the right side and 2+ on the left side. Skin: Skin is warm and dry. No rash noted.  Nursing note and vitals reviewed.   ED Course  Procedures (including critical care time) Labs Review Labs Reviewed - No data to display  Imaging Review No results found. I have personally reviewed and evaluated these images and lab results as part of my medical decision-making.   EKG Interpretation None      MDM   Final diagnoses:  Contusion of lower back, initial encounter    Pt is well appearing, ambulates with a steady gait.  No focal neuro deficits.  No concerning sx;s for emergent neurological or infectious process.  No indication for imaging.  Pt agrees to sx tx and close PMD f/u if not improving.  She appears stable for d/c    Kem Parkinson, PA-C 04/15/15 2326  Leonard Schwartz, MD 04/21/15 [phone]

## 2015-05-10 ENCOUNTER — Emergency Department (HOSPITAL_COMMUNITY)
Admission: EM | Admit: 2015-05-10 | Discharge: 2015-05-10 | Disposition: A | Discharge status: Home / Self Care | Payer: Medicare Other | Attending: Emergency Medicine | Admitting: Emergency Medicine

## 2015-05-10 ENCOUNTER — Encounter (HOSPITAL_COMMUNITY): Payer: Self-pay | Admitting: Emergency Medicine

## 2015-05-10 DIAGNOSIS — I10 Essential (primary) hypertension: Secondary | ICD-10-CM | POA: Insufficient documentation

## 2015-05-10 DIAGNOSIS — M545 Low back pain, unspecified: Secondary | ICD-10-CM

## 2015-05-10 DIAGNOSIS — M62830 Muscle spasm of back: Secondary | ICD-10-CM | POA: Diagnosis not present

## 2015-05-10 DIAGNOSIS — F419 Anxiety disorder, unspecified: Secondary | ICD-10-CM | POA: Diagnosis not present

## 2015-05-10 DIAGNOSIS — Z720 Tobacco use: Secondary | ICD-10-CM | POA: Insufficient documentation

## 2015-05-10 DIAGNOSIS — Z79899 Other long term (current) drug therapy: Secondary | ICD-10-CM | POA: Diagnosis not present

## 2015-05-10 DIAGNOSIS — F329 Major depressive disorder, single episode, unspecified: Secondary | ICD-10-CM | POA: Diagnosis not present

## 2015-05-10 MED ORDER — BACLOFEN 10 MG PO TABS: 10.0000 mg | ORAL_TABLET | Freq: Three times a day (TID) | ORAL | Status: DC

## 2015-05-10 MED ORDER — BACLOFEN 10 MG PO TABS: 10.0000 mg | ORAL_TABLET | Freq: Three times a day (TID) | ORAL | Status: AC

## 2015-05-10 MED ORDER — HYDROCODONE-ACETAMINOPHEN 5-325 MG PO TABS: 1.0000 | ORAL_TABLET | ORAL | Status: DC | PRN

## 2015-05-10 MED ORDER — MELOXICAM 15 MG PO TABS: 15.0000 mg | ORAL_TABLET | Freq: Every day | ORAL | Status: DC

## 2015-05-10 NOTE — ED Notes (Signed)
Pt states she fell in tub on 9/22 and was seen here. States continued pain to tailbone and neck soreness pointing to area ~T1. NAD

## 2015-05-10 NOTE — ED Provider Notes (Signed)
CSN: [id number]     Arrival date & time 05/10/15  1133 History   First MD Initiated Contact with Patient 05/10/15 1219     Chief Complaint  Patient presents with  . Back Pain     (Consider location/radiation/quality/duration/timing/severity/associated sxs/prior Treatment) Patient is a 44 y.o. female presenting with back pain. The history is provided by the patient.  Back Pain Location:  Lumbar spine Quality:  Aching Pain severity:  Moderate Pain is:  Same all the time Onset quality:  Gradual Duration:  1 month Timing:  Intermittent Progression:  Worsening Chronicity:  Recurrent Relieved by:  Narcotics Worsened by:  Movement and twisting Ineffective treatments:  Bed rest Associated symptoms: no bladder incontinence, no bowel incontinence and no perianal numbness     Past Medical History  Diagnosis Date  . Hypertension   . Depression   . Anxiety   . Vertigo   . Headache    Past Surgical History  Procedure Laterality Date  . Small intestine surgery     Family History  Problem Relation Age of Onset  . Cancer Father    Social History  Substance Use Topics  . Smoking status: Current Every Day Smoker -- 0.50 packs/day    Types: Cigarettes  . Smokeless tobacco: Never Used  . Alcohol Use: No     Comment: Occa.   OB History    No data available     Review of Systems  Gastrointestinal: Negative for bowel incontinence.  Genitourinary: Negative for bladder incontinence.  Musculoskeletal: Positive for back pain.  Psychiatric/Behavioral: The patient is nervous/anxious.   All other systems reviewed and are negative.     Allergies  Tramadol  Home Medications   Prior to Admission medications   Medication Sig Start Date End Date Taking? Authorizing Provider  clonazePAM (KLONOPIN) 0.5 MG tablet Take 1 tablet by mouth daily as needed for anxiety.  05/24/14   Historical Provider, MD  cyclobenzaprine (FLEXERIL) 10 MG tablet Take 1 tablet (10 mg total) by mouth 3  (three) times daily as needed. 04/13/15   Tammy Triplett, PA-C  FLUoxetine (PROZAC) 20 MG capsule Take 60 mg by mouth daily.    Historical Provider, MD  HYDROcodone-acetaminophen (NORCO/VICODIN) 5-325 MG per tablet Take one tab po q 4-6 hrs prn pain 04/13/15   Tammy Triplett, PA-C  meclizine (ANTIVERT) 25 MG tablet Take 25 mg by mouth 3 (three) times daily as needed for dizziness.  05/24/14   Historical Provider, MD  naproxen sodium (ANAPROX) 220 MG tablet Take 440 mg by mouth daily as needed (pain).    Historical Provider, MD  ondansetron (ZOFRAN) 4 MG tablet Take 1 tablet (4 mg total) by mouth every 6 (six) hours. 04/13/15   Tammy Triplett, PA-C  QUEtiapine (SEROQUEL) 300 MG tablet Take 600 mg by mouth at bedtime. 01/05/15   Historical Provider, MD   BP 113/75 mmHg  Pulse 112  Temp(Src) 98.5 F (36.9 C) (Oral)  Resp 18  Ht 5\' 3"  (1.6 m)  Wt 168 lb (76.204 kg)  BMI 29.77 kg/m2  SpO2 100%  LMP 05/10/2015 (Exact Date) Physical Exam  Constitutional: She is oriented to person, place, and time. She appears well-developed and well-nourished.  Non-toxic appearance.  HENT:  Head: Normocephalic.  Right Ear: Tympanic membrane and external ear normal.  Left Ear: Tympanic membrane and external ear normal.  Eyes: EOM and lids are normal. Pupils are equal, round, and reactive to light.  Neck: Normal range of motion. Neck supple. Carotid bruit is not  present.  Cardiovascular: Normal rate, regular rhythm, normal heart sounds, intact distal pulses and normal pulses.   Pulmonary/Chest: Breath sounds normal. No respiratory distress.  Abdominal: Soft. Bowel sounds are normal. There is no tenderness. There is no guarding.  Musculoskeletal: Normal range of motion.       Lumbar back: She exhibits tenderness, pain and spasm.  Lymphadenopathy:       Head (right side): No submandibular adenopathy present.       Head (left side): No submandibular adenopathy present.    She has no cervical adenopathy.   Neurological: She is alert and oriented to person, place, and time. She has normal strength. No cranial nerve deficit or sensory deficit.  Skin: Skin is warm and dry.  Psychiatric: She has a normal mood and affect. Her speech is normal.  Nursing note and vitals reviewed.   ED Course  Procedures (including critical care time) Labs Review Labs Reviewed - No data to display  Imaging Review No results found. I have personally reviewed and evaluated these images and lab results as part of my medical decision-making.   EKG Interpretation None      MDM  Patient sustained an injury to the lower back nearly a month ago. She states she is still having a great of pain and problem. On no gross neurologic deficits appreciated at this time. Patient will be treated with baclofen and Norco. Patient is referred to orthopedics.    Final diagnoses:  None    *I have reviewed nursing notes, vital signs, and all appropriate lab and imaging results for this patient.Lily Kocher, PA-C 05/10/15 Rule, MD 05/10/15 1531

## 2015-05-10 NOTE — ED Notes (Signed)
Pt has been seen for same -- stated that back pain is not getting any better

## 2015-05-10 NOTE — Discharge Instructions (Signed)

## 2015-07-06 ENCOUNTER — Emergency Department (HOSPITAL_COMMUNITY): Payer: Medicare Other

## 2015-07-06 ENCOUNTER — Emergency Department (HOSPITAL_COMMUNITY)
Admission: EM | Admit: 2015-07-06 | Discharge: 2015-07-06 | Disposition: A | Discharge status: Home / Self Care | Payer: Medicare Other | Attending: Emergency Medicine | Admitting: Emergency Medicine

## 2015-07-06 ENCOUNTER — Encounter (HOSPITAL_COMMUNITY): Payer: Self-pay | Admitting: Emergency Medicine

## 2015-07-06 DIAGNOSIS — S3992XA Unspecified injury of lower back, initial encounter: Secondary | ICD-10-CM | POA: Diagnosis present

## 2015-07-06 DIAGNOSIS — S300XXA Contusion of lower back and pelvis, initial encounter: Secondary | ICD-10-CM | POA: Diagnosis not present

## 2015-07-06 DIAGNOSIS — F419 Anxiety disorder, unspecified: Secondary | ICD-10-CM | POA: Insufficient documentation

## 2015-07-06 DIAGNOSIS — Y9389 Activity, other specified: Secondary | ICD-10-CM | POA: Insufficient documentation

## 2015-07-06 DIAGNOSIS — I10 Essential (primary) hypertension: Secondary | ICD-10-CM | POA: Insufficient documentation

## 2015-07-06 DIAGNOSIS — F17210 Nicotine dependence, cigarettes, uncomplicated: Secondary | ICD-10-CM | POA: Diagnosis not present

## 2015-07-06 DIAGNOSIS — F329 Major depressive disorder, single episode, unspecified: Secondary | ICD-10-CM | POA: Diagnosis not present

## 2015-07-06 DIAGNOSIS — W01198A Fall on same level from slipping, tripping and stumbling with subsequent striking against other object, initial encounter: Secondary | ICD-10-CM | POA: Diagnosis not present

## 2015-07-06 DIAGNOSIS — Z79899 Other long term (current) drug therapy: Secondary | ICD-10-CM | POA: Diagnosis not present

## 2015-07-06 DIAGNOSIS — Y998 Other external cause status: Secondary | ICD-10-CM | POA: Diagnosis not present

## 2015-07-06 DIAGNOSIS — Y9289 Other specified places as the place of occurrence of the external cause: Secondary | ICD-10-CM | POA: Insufficient documentation

## 2015-07-06 DIAGNOSIS — Z791 Long term (current) use of non-steroidal anti-inflammatories (NSAID): Secondary | ICD-10-CM | POA: Diagnosis not present

## 2015-07-06 MED ORDER — HYDROCODONE-ACETAMINOPHEN 5-325 MG PO TABS: 2.0000 | ORAL_TABLET | ORAL | Status: DC | PRN

## 2015-07-06 MED ORDER — METHOCARBAMOL 500 MG PO TABS: 500.0000 mg | ORAL_TABLET | Freq: Two times a day (BID) | ORAL | Status: DC

## 2015-07-06 MED ORDER — DICLOFENAC SODIUM 75 MG PO TBEC: 75.0000 mg | DELAYED_RELEASE_TABLET | Freq: Two times a day (BID) | ORAL | Status: DC

## 2015-07-06 NOTE — ED Provider Notes (Signed)
CSN: [id number]     Arrival date & time 07/06/15  0905 History   First MD Initiated Contact with Patient 07/06/15 [phone]     Chief Complaint  Patient presents with  . Fall     (Consider location/radiation/quality/duration/timing/severity/associated sxs/prior Treatment) Patient is a 44 y.o. female presenting with fall. The history is provided by the patient. No language interpreter was used.  Fall This is a new problem. The current episode started today. The problem occurs constantly. The problem has been gradually worsening. Pertinent negatives include no weakness. Nothing aggravates the symptoms. She has tried nothing for the symptoms. The treatment provided moderate relief.  Pt reports she fell 2 weeks ago and hit her tailbone.   Pt complains of low back and tailbone pain.   Past Medical History  Diagnosis Date  . Hypertension   . Depression   . Anxiety   . Vertigo   . Headache    Past Surgical History  Procedure Laterality Date  . Small intestine surgery     Family History  Problem Relation Age of Onset  . Cancer Father    Social History  Substance Use Topics  . Smoking status: Current Every Day Smoker -- 0.50 packs/day    Types: Cigarettes  . Smokeless tobacco: Never Used  . Alcohol Use: No     Comment: Occa.   OB History    No data available     Review of Systems  Musculoskeletal: Positive for back pain.  Neurological: Negative for weakness.  All other systems reviewed and are negative.     Allergies  Tramadol  Home Medications   Prior to Admission medications   Medication Sig Start Date End Date Taking? Authorizing Provider  clonazePAM (KLONOPIN) 0.5 MG tablet Take 1 tablet by mouth daily as needed for anxiety.  05/24/14   Historical Provider, MD  cyclobenzaprine (FLEXERIL) 10 MG tablet Take 1 tablet (10 mg total) by mouth 3 (three) times daily as needed. 04/13/15   Tammy Triplett, PA-C  FLUoxetine (PROZAC) 20 MG capsule Take 60 mg by mouth daily.     Historical Provider, MD  HYDROcodone-acetaminophen (NORCO/VICODIN) 5-325 MG tablet Take 1 tablet by mouth every 4 (four) hours as needed. 05/10/15   Lily Kocher, PA-C  meclizine (ANTIVERT) 25 MG tablet Take 25 mg by mouth 3 (three) times daily as needed for dizziness.  05/24/14   Historical Provider, MD  meloxicam (MOBIC) 15 MG tablet Take 1 tablet (15 mg total) by mouth daily. 05/10/15   Lily Kocher, PA-C  naproxen sodium (ANAPROX) 220 MG tablet Take 440 mg by mouth daily as needed (pain).    Historical Provider, MD  ondansetron (ZOFRAN) 4 MG tablet Take 1 tablet (4 mg total) by mouth every 6 (six) hours. 04/13/15   Tammy Triplett, PA-C  QUEtiapine (SEROQUEL) 300 MG tablet Take 600 mg by mouth at bedtime. 01/05/15   Historical Provider, MD   BP 121/91 mmHg  Pulse 98  Temp(Src) 97.8 F (36.6 C) (Oral)  Resp 16  Ht 5\' 3"  (1.6 m)  Wt 80.287 kg  BMI 31.36 kg/m2  SpO2 100%  LMP 06/29/2015 (Approximate) Physical Exam  Constitutional: She is oriented to person, place, and time. She appears well-developed and well-nourished.  HENT:  Head: Normocephalic.  Cardiovascular: Normal rate.   Pulmonary/Chest: Effort normal.  Abdominal: Soft.  Musculoskeletal: She exhibits tenderness.  Tender lower sacral spine and coccyx area, no bruising  Neurological: She is alert and oriented to person, place, and time. She has normal  reflexes.  Skin: Skin is warm.  Psychiatric: She has a normal mood and affect.  Nursing note and vitals reviewed.   ED Course  Procedures (including critical care time) Labs Review Labs Reviewed - No data to display  Imaging Review Dg Sacrum/coccyx  07/06/2015  CLINICAL DATA:  Fall 2 weeks ago with persistent sacral pain, initial encounter EXAM: SACRUM AND COCCYX - 2+ VIEW COMPARISON:  None. FINDINGS: No acute fracture is noted. No coccygeal displacement is seen no soft tissue abnormality is noted. IMPRESSION: No acute abnormality seen. Electronically Signed   By: Inez Catalina M.D.   On: 07/06/2015 11:00   I have personally reviewed and evaluated these images and lab results as part of my medical decision-making.   EKG Interpretation None      MDM   Final diagnoses:  Contusion of lower back, initial encounter    Hydrocodone  Robaxin voltaren     Fransico Meadow, PA-C 07/06/15 Monon, PA-C 07/06/15 1343  Fredia Sorrow, MD 07/07/15 1347

## 2015-07-06 NOTE — ED Notes (Signed)
Pt reports 2 weeks ago falling and hitting back of head on bathtub.  Pt today c/o headache, neckpain, and "sore all over."  Pt ambulatory to room.  Pt alert and oriented.

## 2015-07-06 NOTE — Discharge Instructions (Signed)

## 2015-07-25 ENCOUNTER — Emergency Department (HOSPITAL_COMMUNITY)
Admission: EM | Admit: 2015-07-25 | Discharge: 2015-07-25 | Disposition: A | Discharge status: Home / Self Care | Payer: Medicare Other | Attending: Emergency Medicine | Admitting: Emergency Medicine

## 2015-07-25 ENCOUNTER — Encounter (HOSPITAL_COMMUNITY): Payer: Self-pay | Admitting: Emergency Medicine

## 2015-07-25 DIAGNOSIS — F329 Major depressive disorder, single episode, unspecified: Secondary | ICD-10-CM | POA: Insufficient documentation

## 2015-07-25 DIAGNOSIS — M533 Sacrococcygeal disorders, not elsewhere classified: Secondary | ICD-10-CM | POA: Diagnosis not present

## 2015-07-25 DIAGNOSIS — Z79899 Other long term (current) drug therapy: Secondary | ICD-10-CM | POA: Insufficient documentation

## 2015-07-25 DIAGNOSIS — I10 Essential (primary) hypertension: Secondary | ICD-10-CM | POA: Diagnosis not present

## 2015-07-25 DIAGNOSIS — F17210 Nicotine dependence, cigarettes, uncomplicated: Secondary | ICD-10-CM | POA: Insufficient documentation

## 2015-07-25 DIAGNOSIS — F419 Anxiety disorder, unspecified: Secondary | ICD-10-CM | POA: Diagnosis not present

## 2015-07-25 DIAGNOSIS — M545 Low back pain: Secondary | ICD-10-CM | POA: Diagnosis present

## 2015-07-25 MED ORDER — ONDANSETRON 8 MG PO TBDP: 8.0000 mg | ORAL_TABLET | Freq: Three times a day (TID) | ORAL | Status: AC | PRN

## 2015-07-25 MED ORDER — ONDANSETRON 8 MG PO TBDP
8.0000 mg | ORAL_TABLET | Freq: Once | ORAL | Status: AC
  Administered 2015-07-25: 8 mg via ORAL
  Filled 2015-07-25: qty 1

## 2015-07-25 MED ORDER — DICLOFENAC SODIUM 75 MG PO TBEC: 75.0000 mg | DELAYED_RELEASE_TABLET | Freq: Two times a day (BID) | ORAL | Status: DC

## 2015-07-25 MED ORDER — HYDROCODONE-ACETAMINOPHEN 5-325 MG PO TABS
1.0000 | ORAL_TABLET | Freq: Once | ORAL | Status: AC
Start: 2015-07-25 — End: 2015-07-25
  Administered 2015-07-25: 1 via ORAL
  Filled 2015-07-25: qty 1

## 2015-07-25 MED ORDER — HYDROCODONE-ACETAMINOPHEN 5-325 MG PO TABS: 1.0000 | ORAL_TABLET | ORAL | Status: AC | PRN

## 2015-07-25 NOTE — ED Notes (Signed)
Was seen here ED for fell about 2 weeks ago and did not make an follow up appointment.  Pt here to c/o lower back pain, rates pain 10/10.

## 2015-07-25 NOTE — Discharge Instructions (Signed)
Tailbone Injury °The tailbone (coccyx) is the small bone at the lower end of the spine. A tailbone injury may involve stretched ligaments, bruising, or a broken bone (fracture). Tailbone injuries can be painful, and some may take a long time to heal. °CAUSES °This condition is often caused by falling and landing on the tailbone. Other causes include: °· Repeated strain or friction from actions such as rowing and bicycling. °· Childbirth. °In some cases, the cause may not be known. °RISK FACTORS °This condition is more common in women than in men. °SYMPTOMS °Symptoms of this condition include: °· Pain in the lower back, especially when sitting. °· Pain or difficulty when standing up from a sitting position. °· Bruising in the tailbone area. °· Painful bowel movements. °· In women, pain during intercourse. °DIAGNOSIS °This condition may be diagnosed based on your symptoms and a physical exam. X-rays may be taken if a fracture is suspected. You may also have other tests, such as a CT scan or MRI. °TREATMENT °This condition may be treated with medicines to help relieve your pain. Most tailbone injuries heal on their own in 4-6 weeks. However, recovery time may be longer if the injury involves a fracture. °HOME CARE INSTRUCTIONS °· Take medicines only as directed by your health care provider. °· If directed, apply ice to the injured area: °¨ Put ice in a plastic bag. °¨ Place a towel between your skin and the bag. °¨ Leave the ice on for 20 minutes, 2-3 times per day for the first 1-2 days. °· Sit on a large, rubber or inflated ring or cushion to ease your pain. Lean forward when you are sitting to help decrease discomfort. °· Avoid sitting for long periods of time. °· Increase your activity as the pain allows. Perform any exercises that are recommended by your health care provider or physical therapist. °· If you have pain during bowel movements, use stool softeners as directed by your health care provider. °· Eat a  diet that includes plenty of fiber to help prevent constipation. °· Keep all follow-up visits as directed by your health care provider. This is important. °PREVENTION °Wear appropriate padding and sports gear when bicycling and rowing. This can help to prevent developing an injury that is caused by repeated strain or friction. °SEEK MEDICAL CARE IF: °· Your pain becomes worse. °· Your bowel movements cause a great deal of discomfort. °· You are unable to have a bowel movement. °· You have uncontrolled urine loss (urinary incontinence). °· You have a fever. °  °This information is not intended to replace advice given to you by your health care provider. Make sure you discuss any questions you have with your health care provider. °  °Document Released: 07/05/2000 Document Revised: 11/22/2014 Document Reviewed: 07/04/2014 °Elsevier Interactive Patient Education ©2016 [employer] ° °

## 2015-07-27 NOTE — ED Provider Notes (Signed)
CSN: [id number]     Arrival date & time 07/25/15  [id number] History   First MD Initiated Contact with Patient 07/25/15 1142     Chief Complaint  Patient presents with  . Back Pain     (Consider location/radiation/quality/duration/timing/severity/associated sxs/prior Treatment) The history is provided by the patient.   Shannon Clements is a 45 y.o. female presenting with persistent pain at her coccyx, worsened with sitting or applying any pressure at this site since slipping and falling directly on her buttocks early December.  She was seen here for this complaint on 12/15 at which time imaging revealed no fractures or dislocations.  She states she was unaware of any referral given for orthopedic followup when asked and has not contacted her pcp regarding this injury either.  She denies weakness or numbness in her legs, denies urinary or fecal incontinence or retention.  She has completed her course of robaxin and voltaren and hydrocodone and continues to use heat which she states was helping although questioned benefit of the robaxin.     Past Medical History  Diagnosis Date  . Hypertension   . Depression   . Anxiety   . Vertigo   . Headache    Past Surgical History  Procedure Laterality Date  . Small intestine surgery     Family History  Problem Relation Age of Onset  . Cancer Father    Social History  Substance Use Topics  . Smoking status: Current Every Day Smoker -- 0.50 packs/day    Types: Cigarettes  . Smokeless tobacco: Never Used  . Alcohol Use: No     Comment: Occa.   OB History    No data available     Review of Systems  Constitutional: Negative for fever.  Respiratory: Negative for shortness of breath.   Cardiovascular: Negative for chest pain and leg swelling.  Gastrointestinal: Negative for abdominal pain, constipation and abdominal distention.  Genitourinary: Negative for dysuria, urgency, frequency, flank pain and difficulty urinating.  Musculoskeletal:  Positive for back pain. Negative for joint swelling and gait problem.  Skin: Negative for rash.  Neurological: Negative for weakness and numbness.      Allergies  Tramadol  Home Medications   Prior to Admission medications   Medication Sig Start Date End Date Taking? Authorizing Provider  clonazePAM (KLONOPIN) 0.5 MG tablet Take 1 tablet by mouth daily as needed for anxiety.  05/24/14  Yes Historical Provider, MD  FLUoxetine (PROZAC) 20 MG capsule Take 60 mg by mouth daily.   Yes Historical Provider, MD  naproxen sodium (ANAPROX) 220 MG tablet Take 440 mg by mouth daily as needed (pain).   Yes Historical Provider, MD  QUEtiapine (SEROQUEL) 300 MG tablet Take 600 mg by mouth at bedtime. 01/05/15  Yes Historical Provider, MD  diclofenac (VOLTAREN) 75 MG EC tablet Take 1 tablet (75 mg total) by mouth 2 (two) times daily. 07/25/15   Evalee Jefferson, PA-C  HYDROcodone-acetaminophen (NORCO/VICODIN) 5-325 MG tablet Take 1 tablet by mouth every 4 (four) hours as needed. 07/25/15   Evalee Jefferson, PA-C  ondansetron (ZOFRAN ODT) 8 MG disintegrating tablet Take 1 tablet (8 mg total) by mouth every 8 (eight) hours as needed for nausea or vomiting. 07/25/15   Evalee Jefferson, PA-C   BP 116/80 mmHg  Pulse 98  Temp(Src) 98.4 F (36.9 C) (Oral)  Resp 18  Ht 5\' 3"  (1.6 m)  Wt 77.565 kg  BMI 30.30 kg/m2  SpO2 100%  LMP 06/29/2015 (Approximate) Physical Exam  Constitutional:  She appears well-developed and well-nourished.  HENT:  Head: Normocephalic.  Eyes: Conjunctivae are normal.  Neck: Normal range of motion. Neck supple.  Cardiovascular: Normal rate and intact distal pulses.   Pedal pulses normal.  Pulmonary/Chest: Effort normal.  Abdominal: Soft. Bowel sounds are normal. She exhibits no distension and no mass.  Musculoskeletal: Normal range of motion. She exhibits no edema.       Lumbar back: She exhibits tenderness. She exhibits no swelling, no edema and no spasm.  TTP midline sacrum and coccyx.  No  bruising,  No hematoma appreciated.  Neurological: She is alert. She has normal strength. She displays no atrophy and no tremor. No sensory deficit. Gait normal.  Reflex Scores:      Patellar reflexes are 2+ on the right side and 2+ on the left side.      Achilles reflexes are 2+ on the right side and 2+ on the left side. No strength deficit noted in hip and knee flexor and extensor muscle groups.  Ankle flexion and extension intact.  Skin: Skin is warm and dry.  Psychiatric: She has a normal mood and affect.  Nursing note and vitals reviewed.   ED Course  Procedures (including critical care time) Labs Review Labs Reviewed - No data to display  Imaging Review No results found. I have personally reviewed and evaluated these images and lab results as part of my medical decision-making.   EKG Interpretation None      MDM   Final diagnoses:  Coccydynia    Imaging from prior visit reviewed and discussed with pt.  No indication for repeat imaging today.  She was advised she needs to f/u with her pcp for a recheck if needed. Advised it can take 6-8 weeks for a coccygeal contusion to heal and be pain free.  She was advised to find a donut pillow for more comfortable sitting. Prescribed voltaren and hydrocodone.  Advised that I will not be able to prescribe any additional narcotics to her for this injury and she was strongly advised to f/u with pcp prn.  Advised continued heat tx.  Prior to dc, pt asked for anti- emetic form her longstanding intermittent nausea.  She denies abdominal pain, states she has "kept nausea" for years.  Prescribed zofran for this complaint.    Evalee Jefferson, PA-C 07/27/15 Driscoll, PA-C 07/27/15 Weldon Liu, MD 07/27/15 [phone]

## 2015-08-20 ENCOUNTER — Encounter (HOSPITAL_COMMUNITY): Payer: Self-pay

## 2015-08-20 ENCOUNTER — Emergency Department (HOSPITAL_COMMUNITY)
Admission: EM | Admit: 2015-08-20 | Discharge: 2015-08-20 | Disposition: A | Discharge status: Home / Self Care | Payer: Medicare Other | Attending: Emergency Medicine | Admitting: Emergency Medicine

## 2015-08-20 DIAGNOSIS — Z79899 Other long term (current) drug therapy: Secondary | ICD-10-CM | POA: Diagnosis not present

## 2015-08-20 DIAGNOSIS — F17210 Nicotine dependence, cigarettes, uncomplicated: Secondary | ICD-10-CM | POA: Diagnosis not present

## 2015-08-20 DIAGNOSIS — F419 Anxiety disorder, unspecified: Secondary | ICD-10-CM | POA: Insufficient documentation

## 2015-08-20 DIAGNOSIS — M533 Sacrococcygeal disorders, not elsewhere classified: Secondary | ICD-10-CM | POA: Diagnosis not present

## 2015-08-20 DIAGNOSIS — M545 Low back pain, unspecified: Secondary | ICD-10-CM

## 2015-08-20 DIAGNOSIS — I10 Essential (primary) hypertension: Secondary | ICD-10-CM | POA: Diagnosis not present

## 2015-08-20 DIAGNOSIS — F329 Major depressive disorder, single episode, unspecified: Secondary | ICD-10-CM | POA: Diagnosis not present

## 2015-08-20 DIAGNOSIS — Z791 Long term (current) use of non-steroidal anti-inflammatories (NSAID): Secondary | ICD-10-CM | POA: Diagnosis not present

## 2015-08-20 MED ORDER — BACLOFEN 10 MG PO TABS: 10.0000 mg | ORAL_TABLET | Freq: Three times a day (TID) | ORAL | Status: AC

## 2015-08-20 MED ORDER — HYDROCODONE-ACETAMINOPHEN 5-325 MG PO TABS
1.0000 | ORAL_TABLET | Freq: Once | ORAL | Status: AC
Start: 2015-08-20 — End: 2015-08-20
  Administered 2015-08-20: 1 via ORAL
  Filled 2015-08-20: qty 1

## 2015-08-20 NOTE — ED Provider Notes (Signed)
CSN: [id number]     Arrival date & time 08/20/15  1103 History  By signing my name below, I, Hansel Feinstein, attest that this documentation has been prepared under the direction and in the presence of Gwendloyn Forsee, PA-C. Electronically Signed: Hansel Feinstein, ED Scribe. 08/20/2015. 11:28 AM.     Chief Complaint  Patient presents with  . Back Pain   The history is provided by the patient. No language interpreter was used.    HPI Comments: Shannon Clements is a 45 y.o. female with h/o HTN who presents to the Emergency Department complaining of moderate, aching lower back, coccyx and bilateral hip pain and soreness onset over a month ago after falling in the bathtub. She reports that she slipped and landed on her tailbone, then hit her head in the tub. No LOC. Pt was seen in the ED for the initial fall on on 07/06/15 and again on 07/25/15. XR was negative for acute findings and pt was advised to follow up with PCP. She notes that she has taken ibuprofen, naproxen, acetaminophen, robaxin and tried ice and heat with little to no relief of pains. Pt states that movement, ambulation, certain positions and sitting worsens her pain. Pt reports that she has called to follow up with a specialist but notes she does not have adequate transportation. She denies emesis, nausea, fever, chills, dysuria, difficulty urinating, bowel or bladder incontinence, numbness, focal weakness and paresthesia in the BLE.   Past Medical History  Diagnosis Date  . Hypertension   . Depression   . Anxiety   . Vertigo   . Headache    Past Surgical History  Procedure Laterality Date  . Small intestine surgery     Family History  Problem Relation Age of Onset  . Cancer Father    Social History  Substance Use Topics  . Smoking status: Current Every Day Smoker -- 0.50 packs/day    Types: Cigarettes  . Smokeless tobacco: Never Used  . Alcohol Use: No     Comment: Occa.   OB History    No data available     Review of Systems   Constitutional: Negative for fever and chills.  Gastrointestinal: Negative for nausea and vomiting.  Genitourinary: Negative for dysuria, flank pain, decreased urine volume and difficulty urinating.  Musculoskeletal: Positive for back pain.       Tailbone pain  Skin: Negative for color change.  Neurological: Negative for syncope, weakness and numbness.   Allergies  Tramadol  Home Medications   Prior to Admission medications   Medication Sig Start Date End Date Taking? Authorizing Provider  clonazePAM (KLONOPIN) 0.5 MG tablet Take 1 tablet by mouth daily as needed for anxiety.  05/24/14   Historical Provider, MD  diclofenac (VOLTAREN) 75 MG EC tablet Take 1 tablet (75 mg total) by mouth 2 (two) times daily. 07/25/15   Evalee Jefferson, PA-C  FLUoxetine (PROZAC) 20 MG capsule Take 60 mg by mouth daily.    Historical Provider, MD  HYDROcodone-acetaminophen (NORCO/VICODIN) 5-325 MG tablet Take 1 tablet by mouth every 4 (four) hours as needed. 07/25/15   Evalee Jefferson, PA-C  naproxen sodium (ANAPROX) 220 MG tablet Take 440 mg by mouth daily as needed (pain).    Historical Provider, MD  ondansetron (ZOFRAN ODT) 8 MG disintegrating tablet Take 1 tablet (8 mg total) by mouth every 8 (eight) hours as needed for nausea or vomiting. 07/25/15   Evalee Jefferson, PA-C  QUEtiapine (SEROQUEL) 300 MG tablet Take 600 mg by  mouth at bedtime. 01/05/15   Historical Provider, MD   BP 126/78 mmHg  Pulse 85  Temp(Src) 97.6 F (36.4 C) (Oral)  Resp 18  Ht 5\' 3"  (1.6 m)  Wt 167 lb (75.751 kg)  BMI 29.59 kg/m2  SpO2 100%  LMP 08/06/2015 Physical Exam  Constitutional: She is oriented to person, place, and time. She appears well-developed and well-nourished.  HENT:  Head: Normocephalic and atraumatic.  Eyes: Conjunctivae and EOM are normal. Pupils are equal, round, and reactive to light.  Neck: Normal range of motion and full passive range of motion without pain. Neck supple.  Cardiovascular: Normal rate, regular rhythm and  intact distal pulses.  Exam reveals no gallop and no friction rub.   No murmur heard. Pulmonary/Chest: Effort normal and breath sounds normal. No respiratory distress. She has no wheezes. She has no rales.  Abdominal: Soft. She exhibits no distension. There is no tenderness. There is no rebound and no guarding.  Musculoskeletal: Normal range of motion. She exhibits tenderness.  Diffuse tenderness to the lower lumbar spine and paraspinal musculature. Mild tenderness at the coccyx. No edema or ecchymosis. 5/5 strength against resistance of the BLE. Distal sensation intact   Neurological: She is alert and oriented to person, place, and time. She has normal strength. No sensory deficit.  Skin: Skin is warm and dry.  Psychiatric: She has a normal mood and affect. Her behavior is normal.  Nursing note and vitals reviewed.   ED Course  Procedures (including critical care time) DIAGNOSTIC STUDIES: Oxygen Saturation is 100% on RA, normal by my interpretation.    COORDINATION OF CARE: 11:25 AM Discussed treatment plan with pt at bedside which includes pain management and follow up and pt agreed to plan.    MDM   Final diagnoses:  Midline low back pain without sciatica  Coccydynia    Patient with chronic back pain and seen here several times previously for same.  No neurological deficits and normal neuro exam.  Patient is ambulatory.  No loss of bowel or bladder control.  No concern for cauda equina. No red flags. No fever, night sweats, weight loss, h/o cancer, IVDA, no recent procedure to back. No urinary symptoms suggestive of UTI.  Supportive care and return precaution discussed. Appears safe for discharge at this time. Follow up as indicated in discharge paperwork.  I have explained that per my evaluation, further prescription narcotic pain medication is not indicated   I personally performed the services described in this documentation, which was scribed in my presence. The recorded  information has been reviewed and is accurate.   Kem Parkinson, PA-C 08/20/15 Hughes, MD 08/22/15 [phone]

## 2015-08-20 NOTE — ED Notes (Signed)
Pt reports that she fell in bathroom 2 weeks ago and injured lower back.pain in both legs as well

## 2015-08-20 NOTE — Discharge Instructions (Signed)
Back Pain, Adult Back pain is very common. The pain often gets better over time. The cause of back pain is usually not dangerous. Most people can learn to manage their back pain on their own.  HOME CARE  Watch your back pain for any changes. The following actions may help to lessen any pain you are feeling:  Stay active. Start with short walks on flat ground if you can. Try to walk farther each day.  Exercise regularly as told by your doctor. Exercise helps your back heal faster. It also helps avoid future injury by keeping your muscles strong and flexible.  Do not sit, drive, or stand in one place for more than 30 minutes.  Do not stay in bed. Resting more than 1-2 days can slow down your recovery.  Be careful when you bend or lift an object. Use good form when lifting:  Bend at your knees.  Keep the object close to your body.  Do not twist.  Sleep on a firm mattress. Lie on your side, and bend your knees. If you lie on your back, put a pillow under your knees.  Take medicines only as told by your doctor.  Put ice on the injured area.  Put ice in a plastic bag.  Place a towel between your skin and the bag.  Leave the ice on for 20 minutes, 2-3 times a day for the first 2-3 days. After that, you can switch between ice and heat packs.  Avoid feeling anxious or stressed. Find good ways to deal with stress, such as exercise.  Maintain a healthy weight. Extra weight puts stress on your back. GET HELP IF:   You have pain that does not go away with rest or medicine.  You have worsening pain that goes down into your legs or buttocks.  You have pain that does not get better in one week.  You have pain at night.  You lose weight.  You have a fever or chills. GET HELP RIGHT AWAY IF:   You cannot control when you poop (bowel movement) or pee (urinate).  Your arms or legs feel weak.  Your arms or legs lose feeling (numbness).  You feel sick to your stomach (nauseous) or  throw up (vomit).  You have belly (abdominal) pain.  You feel like you may pass out (faint).   This information is not intended to replace advice given to you by your health care provider. Make sure you discuss any questions you have with your health care provider.   Document Released: 12/25/2007 Document Revised: 07/29/2014 Document Reviewed: 11/09/2013 Elsevier Interactive Patient Education 2016 [place] Injury The tailbone is the small bone at the lower end of the backbone (spine). You may have stretched tissues, bruises, or a broken bone (fracture). These injuries can be painful. Most tailbone injuries get better on their own in 4-6 weeks. HOME CARE  Take medicines only as told by your doctor.  If told, apply ice to the injured area.  Put ice in a plastic bag.  Place a towel between your skin and the bag.  Leave the ice on for 20 minutes, 2-3 times per day. Do this for the first 1-2 days.  Sit on a large, rubber or inflated ring or cushion to lessen pain. Lean forward when you sit to help lessen pain.  Avoid sitting in one place for a long time.  Increase your activity as the pain allows.  Do exercises as told by your doctor  or physical therapist.  If it is painful to poop, take medicine to help you poop (stool softeners) as told by your doctor.  Eat foods that have plenty of fiber.  Keep all follow-up visits as told by your doctor. This is important. GET HELP IF:  Your pain gets worse.  Pooping causes you pain.  You cannot poop (constipation).  You are leaking pee (urinary incontinence).  You have a fever.   This information is not intended to replace advice given to you by your health care provider. Make sure you discuss any questions you have with your health care provider.   Document Released: 08/10/2010 Document Revised: 11/22/2014 Document Reviewed: 07/04/2014 Elsevier Interactive Patient Education [employer].

## 2015-09-24 ENCOUNTER — Encounter (HOSPITAL_COMMUNITY): Payer: Self-pay

## 2015-09-24 ENCOUNTER — Emergency Department (HOSPITAL_COMMUNITY): Payer: Medicare Other

## 2015-09-24 ENCOUNTER — Emergency Department (HOSPITAL_COMMUNITY)
Admission: EM | Admit: 2015-09-24 | Discharge: 2015-09-24 | Disposition: A | Discharge status: Home / Self Care | Payer: Medicare Other | Attending: Emergency Medicine | Admitting: Emergency Medicine

## 2015-09-24 DIAGNOSIS — Z791 Long term (current) use of non-steroidal anti-inflammatories (NSAID): Secondary | ICD-10-CM | POA: Insufficient documentation

## 2015-09-24 DIAGNOSIS — M545 Low back pain: Secondary | ICD-10-CM | POA: Insufficient documentation

## 2015-09-24 DIAGNOSIS — Z79899 Other long term (current) drug therapy: Secondary | ICD-10-CM | POA: Insufficient documentation

## 2015-09-24 DIAGNOSIS — I10 Essential (primary) hypertension: Secondary | ICD-10-CM | POA: Diagnosis not present

## 2015-09-24 DIAGNOSIS — F17210 Nicotine dependence, cigarettes, uncomplicated: Secondary | ICD-10-CM | POA: Diagnosis not present

## 2015-09-24 DIAGNOSIS — F329 Major depressive disorder, single episode, unspecified: Secondary | ICD-10-CM | POA: Insufficient documentation

## 2015-09-24 DIAGNOSIS — F419 Anxiety disorder, unspecified: Secondary | ICD-10-CM | POA: Diagnosis not present

## 2015-09-24 DIAGNOSIS — Z3202 Encounter for pregnancy test, result negative: Secondary | ICD-10-CM | POA: Diagnosis not present

## 2015-09-24 LAB — URINALYSIS, ROUTINE W REFLEX MICROSCOPIC
Bilirubin Urine: NEGATIVE
Glucose, UA: NEGATIVE mg/dL
Ketones, ur: NEGATIVE mg/dL
Leukocytes, UA: NEGATIVE
NITRITE: NEGATIVE
PROTEIN: NEGATIVE mg/dL
Specific Gravity, Urine: 1.015 (ref 1.005–1.030)
pH: 6 (ref 5.0–8.0)

## 2015-09-24 LAB — URINE MICROSCOPIC-ADD ON: WBC, UA: NONE SEEN WBC/hpf (ref 0–5)

## 2015-09-24 LAB — POC URINE PREG, ED: Preg Test, Ur: NEGATIVE

## 2015-09-24 MED ORDER — DIAZEPAM 5 MG PO TABS
5.0000 mg | ORAL_TABLET | Freq: Once | ORAL | Status: AC
  Administered 2015-09-24: 5 mg via ORAL
  Filled 2015-09-24: qty 1

## 2015-09-24 MED ORDER — DICLOFENAC SODIUM 75 MG PO TBEC: 75.0000 mg | DELAYED_RELEASE_TABLET | Freq: Two times a day (BID) | ORAL | Status: AC

## 2015-09-24 MED ORDER — DEXAMETHASONE SODIUM PHOSPHATE 4 MG/ML IJ SOLN
4.0000 mg | Freq: Once | INTRAMUSCULAR | Status: AC
  Administered 2015-09-24: 4 mg via INTRAMUSCULAR
  Filled 2015-09-24: qty 1

## 2015-09-24 MED ORDER — ACETAMINOPHEN-CODEINE #3 300-30 MG PO TABS
2.0000 | ORAL_TABLET | Freq: Once | ORAL | Status: AC
  Administered 2015-09-24: 2 via ORAL
  Filled 2015-09-24: qty 2

## 2015-09-24 MED ORDER — KETOROLAC TROMETHAMINE 60 MG/2ML IM SOLN
60.0000 mg | Freq: Once | INTRAMUSCULAR | Status: AC
  Administered 2015-09-24: 60 mg via INTRAMUSCULAR

## 2015-09-24 MED ORDER — CYCLOBENZAPRINE HCL 10 MG PO TABS: 10.0000 mg | ORAL_TABLET | Freq: Three times a day (TID) | ORAL | Status: AC

## 2015-09-24 MED ORDER — KETOROLAC TROMETHAMINE 60 MG/2ML IM SOLN
INTRAMUSCULAR | Status: AC
  Administered 2015-09-24: 19:00:00
  Filled 2015-09-24: qty 2

## 2015-09-24 MED ORDER — PROMETHAZINE HCL 12.5 MG PO TABS
12.5000 mg | ORAL_TABLET | Freq: Once | ORAL | Status: AC
  Administered 2015-09-24: 12.5 mg via ORAL
  Filled 2015-09-24: qty 1

## 2015-09-24 NOTE — ED Notes (Signed)
Pt to bathroom for UA- She ambulates heel to toe without stagger or drift, her movement is fluid - urine obtained

## 2015-09-24 NOTE — ED Notes (Signed)
Complains of lower back pain that radiates down both legs x2 weeks. Denies recent injury. Denies urinary issues. Ambulatory to triage without difficulty.

## 2015-09-24 NOTE — Discharge Instructions (Signed)
Your urine test is negative for infection or kidney stone. The x-ray of your lumbar spine and coccyx are negative for acute fracture or other problems. Your emergency department workup tonight is negative for emergent changes or problems. Please see Dr. Lorin Clements, or member of his team for orthopedic evaluation of your pain. Use diclofenac 2 times daily with food. Use Flexeril 3 times daily for spasm type pain. Flexeril may cause drowsiness, please do not drive, drink, operate machinery, or the anticipated activities requiring concentration when taking this medication. Back Pain, Adult Back pain is very common in adults.The cause of back pain is rarely dangerous and the pain often gets better over time.The cause of your back pain may not be known. Some common causes of back pain include:  Strain of the muscles or ligaments supporting the spine.  Wear and tear (degeneration) of the spinal disks.  Arthritis.  Direct injury to the back. For many people, back pain may return. Since back pain is rarely dangerous, most people can learn to manage this condition on their own. HOME CARE INSTRUCTIONS Watch your back pain for any changes. The following actions may help to lessen any discomfort you are feeling:  Remain active. It is stressful on your back to sit or stand in one place for long periods of time. Do not sit, drive, or stand in one place for more than 30 minutes at a time. Take short walks on even surfaces as soon as you are able.Try to increase the length of time you walk each day.  Exercise regularly as directed by your health care provider. Exercise helps your back heal faster. It also helps avoid future injury by keeping your muscles strong and flexible.  Do not stay in bed.Resting more than 1-2 days can delay your recovery.  Pay attention to your body when you bend and lift. The most comfortable positions are those that put less stress on your recovering back. Always use proper lifting  techniques, including:  Bending your knees.  Keeping the load close to your body.  Avoiding twisting.  Find a comfortable position to sleep. Use a firm mattress and lie on your side with your knees slightly bent. If you lie on your back, put a pillow under your knees.  Avoid feeling anxious or stressed.Stress increases muscle tension and can worsen back pain.It is important to recognize when you are anxious or stressed and learn ways to manage it, such as with exercise.  Take medicines only as directed by your health care provider. Over-the-counter medicines to reduce pain and inflammation are often the most helpful.Your health care provider may prescribe muscle relaxant drugs.These medicines help dull your pain so you can more quickly return to your normal activities and healthy exercise.  Apply ice to the injured area:  Put ice in a plastic bag.  Place a towel between your skin and the bag.  Leave the ice on for 20 minutes, 2-3 times a day for the first 2-3 days. After that, ice and heat may be alternated to reduce pain and spasms.  Maintain a healthy weight. Excess weight puts extra stress on your back and makes it difficult to maintain good posture. SEEK MEDICAL CARE IF:  You have pain that is not relieved with rest or medicine.  You have increasing pain going down into the legs or buttocks.  You have pain that does not improve in one week.  You have night pain.  You lose weight.  You have a fever or chills.  SEEK IMMEDIATE MEDICAL CARE IF:   You develop new bowel or bladder control problems.  You have unusual weakness or numbness in your arms or legs.  You develop nausea or vomiting.  You develop abdominal pain.  You feel faint.   This information is not intended to replace advice given to you by your health care provider. Make sure you discuss any questions you have with your health care provider.   Document Released: 07/08/2005 Document Revised: 07/29/2014  Document Reviewed: 11/09/2013 Elsevier Interactive Patient Education [employer].

## 2015-09-24 NOTE — ED Notes (Signed)
toradol 60 mg given only one time- the computer in the pt room is broken and pt med had been given

## 2015-09-24 NOTE — ED Provider Notes (Signed)
CSN: [id number]     Arrival date & time 09/24/15  1634 History  By signing my name below, I, Stephania Fragmin, attest that this documentation has been prepared under the direction and in the presence of Lily Kocher, PA-C. Electronically Signed: Stephania Fragmin, ED Scribe. 09/24/2015. 5:40 PM.   Chief Complaint  Patient presents with  . Back Pain   Patient is a 45 y.o. female presenting with back pain. The history is provided by the patient. No language interpreter was used.  Back Pain Location:  Lumbar spine Duration:  3 weeks Timing:  Constant Progression:  Worsening Chronicity:  New Context: falling   Relieved by:  None tried Worsened by:  Nothing tried Ineffective treatments:  None tried Associated symptoms: no bladder incontinence, no bowel incontinence and no dysuria     HPI Comments: Shannon Clements is a 45 y.o. female with a history of hypertension, depression, anxiety, vertigo, and headache, who presents to the Emergency Department complaining of constant, lower back pain radiating to her tailbone, that began after she had fallen in a bathtub 2-3 weeks ago. She states she did not have any medical evaluation following her fall. She denies a history of any back surgeries. She states she has not brought up her back pain with her PCP. She denies blood in her stool, hematuria, urinary frequency, dysuria, bowel or bladder incontinence, or frequent falls.  Past Medical History  Diagnosis Date  . Hypertension   . Depression   . Anxiety   . Vertigo   . Headache    Past Surgical History  Procedure Laterality Date  . Small intestine surgery     Family History  Problem Relation Age of Onset  . Cancer Father    Social History  Substance Use Topics  . Smoking status: Current Every Day Smoker -- 0.50 packs/day    Types: Cigarettes  . Smokeless tobacco: Never Used  . Alcohol Use: No     Comment: [place].   OB History    No data available     Review of Systems  Gastrointestinal: Negative  for blood in stool and bowel incontinence.       Negative for bowel incontinence.  Genitourinary: Negative for bladder incontinence, dysuria, frequency and hematuria.       Negative for bladder incontinence.  Musculoskeletal: Positive for back pain.  All other systems reviewed and are negative.     Allergies  Tramadol  Home Medications   Prior to Admission medications   Medication Sig Start Date End Date Taking? Authorizing Provider  baclofen (LIORESAL) 10 MG tablet Take 1 tablet (10 mg total) by mouth 3 (three) times daily. 08/20/15   Tammy Triplett, PA-C  clonazePAM (KLONOPIN) 0.5 MG tablet Take 1 tablet by mouth daily as needed for anxiety.  05/24/14   Historical Provider, MD  diclofenac (VOLTAREN) 75 MG EC tablet Take 1 tablet (75 mg total) by mouth 2 (two) times daily. 07/25/15   Evalee Jefferson, PA-C  FLUoxetine (PROZAC) 20 MG capsule Take 60 mg by mouth daily.    Historical Provider, MD  HYDROcodone-acetaminophen (NORCO/VICODIN) 5-325 MG tablet Take 1 tablet by mouth every 4 (four) hours as needed. 07/25/15   Evalee Jefferson, PA-C  naproxen sodium (ANAPROX) 220 MG tablet Take 440 mg by mouth daily as needed (pain).    Historical Provider, MD  ondansetron (ZOFRAN ODT) 8 MG disintegrating tablet Take 1 tablet (8 mg total) by mouth every 8 (eight) hours as needed for nausea or vomiting. 07/25/15  Evalee Jefferson, PA-C  QUEtiapine (SEROQUEL) 300 MG tablet Take 600 mg by mouth at bedtime. 01/05/15   Historical Provider, MD   BP 103/66 mmHg  Pulse 87  Temp(Src) 97.6 F (36.4 C) (Oral)  Resp 16  Ht 5\' 3"  (1.6 m)  Wt 174 lb (78.926 kg)  BMI 30.83 kg/m2  SpO2 100%  LMP 09/20/2015 Physical Exam  Constitutional: She is oriented to person, place, and time. She appears well-developed and well-nourished. No distress.  HENT:  Head: Normocephalic and atraumatic.  Eyes: Conjunctivae and EOM are normal.  Neck: Neck supple. No tracheal deviation present.  Cardiovascular: Normal rate.   Pulmonary/Chest:  Effort normal. No respiratory distress.  Abdominal: Soft. Bowel sounds are normal.  Musculoskeletal: Normal range of motion. She exhibits tenderness.  There is lower lumbar and lumbar paraspinal tenderness to palpation.  Neurological: She is alert and oriented to person, place, and time.  No sensory deficits.   Skin: Skin is warm and dry.  Psychiatric: She has a normal mood and affect. Her behavior is normal.  Nursing note and vitals reviewed.   ED Course  I was informed by nursing that the patient received intramuscular Toradol, and intramuscular Decadron in era. I have reviewed the allergies, the patient has no allergies to these particular medications.   Procedures (including critical care time)  DIAGNOSTIC STUDIES: Oxygen Saturation is 100% on RA, normal by my interpretation.    COORDINATION OF CARE: 5:36 PM - Discussed treatment plan with pt at bedside which includes XR of the L-spine and coccyx. Pt verbalized understanding and agreed to plan.   Labs Review Labs Reviewed  URINALYSIS, ROUTINE W REFLEX MICROSCOPIC (NOT AT [facility])    Imaging Review No results found. I have personally reviewed and evaluated these images and lab results as part of my medical decision-making.   EKG Interpretation None      MDM  Vital signs reviewed. UA is neg for infection or stone. Urine pregnancy is negative. Lumbar spine x-rays are negative. Sacral and coccyx x-rays are also negative for fracture or dislocation.  After medications, the patient is ambulatory in the [facility] without any problem. No gross neurologic deficits appreciated at this time. The patient is given prescription for Flexeril and Voltaren. She is asked to follow with Dr. Woody Seller for continued management of her back issues.    Final diagnoses:  Low back pain without sciatica, unspecified back pain laterality    **I personally performed the services described in this documentation, which was scribed in my presence. The  recorded information has been reviewed and is accurate.Lily Kocher, PA-C 09/26/15 [place], DO 09/27/15 [phone]

## 2015-09-24 NOTE — ED Notes (Signed)
Pt request prescription for home- she continues to move fluidly

## 2016-01-04 ENCOUNTER — Encounter (HOSPITAL_COMMUNITY): Payer: Self-pay | Admitting: Emergency Medicine

## 2016-01-04 ENCOUNTER — Emergency Department (HOSPITAL_COMMUNITY)
Admission: EM | Admit: 2016-01-04 | Discharge: 2016-01-04 | Discharge status: Absent without leave | Payer: Medicare Other

## 2016-01-04 ENCOUNTER — Emergency Department (HOSPITAL_COMMUNITY)
Admission: EM | Admit: 2016-01-04 | Discharge: 2016-01-04 | Disposition: A | Discharge status: Home / Self Care | Payer: Medicare Other | Attending: Emergency Medicine | Admitting: Emergency Medicine

## 2016-01-04 DIAGNOSIS — M545 Low back pain: Secondary | ICD-10-CM | POA: Insufficient documentation

## 2016-01-04 DIAGNOSIS — Z79899 Other long term (current) drug therapy: Secondary | ICD-10-CM | POA: Insufficient documentation

## 2016-01-04 DIAGNOSIS — R52 Pain, unspecified: Secondary | ICD-10-CM | POA: Diagnosis present

## 2016-01-04 DIAGNOSIS — I10 Essential (primary) hypertension: Secondary | ICD-10-CM | POA: Insufficient documentation

## 2016-01-04 DIAGNOSIS — M542 Cervicalgia: Secondary | ICD-10-CM | POA: Insufficient documentation

## 2016-01-04 DIAGNOSIS — M791 Myalgia, unspecified site: Secondary | ICD-10-CM

## 2016-01-04 DIAGNOSIS — F17210 Nicotine dependence, cigarettes, uncomplicated: Secondary | ICD-10-CM | POA: Insufficient documentation

## 2016-01-04 DIAGNOSIS — F329 Major depressive disorder, single episode, unspecified: Secondary | ICD-10-CM | POA: Insufficient documentation

## 2016-01-04 MED ORDER — NAPROXEN 500 MG PO TABS: 500.0000 mg | ORAL_TABLET | Freq: Two times a day (BID) | ORAL | Status: AC

## 2016-01-04 MED ORDER — CYCLOBENZAPRINE HCL 10 MG PO TABS: 10.0000 mg | ORAL_TABLET | Freq: Two times a day (BID) | ORAL | Status: AC | PRN

## 2016-01-04 NOTE — Discharge Instructions (Signed)
Muscle Pain, Adult Muscle pain (myalgia) may be caused by many things, including:  Overuse or muscle strain, especially if you are not in shape. This is the most common cause of muscle pain.  Injury.  Bruises.  Viruses, such as the flu.  Infectious diseases.  Fibromyalgia, which is a chronic condition that causes muscle tenderness, fatigue, and headache.  Autoimmune diseases, including lupus.  Certain drugs, including ACE inhibitors and statins. Muscle pain may be mild or severe. In most cases, the pain lasts only a short time and goes away without treatment. To diagnose the cause of your muscle pain, your health care provider will take your medical history. This means he or she will ask you when your muscle pain began and what has been happening. If you have not had muscle pain for very long, your health care provider may want to wait before doing much testing. If your muscle pain has lasted a long time, your health care provider may want to run tests right away. If your health care provider thinks your muscle pain may be caused by illness, you may need to have additional tests to rule out certain conditions.  Treatment for muscle pain depends on the cause. Home care is often enough to relieve muscle pain. Your health care provider may also prescribe anti-inflammatory medicine. HOME CARE INSTRUCTIONS Watch your condition for any changes. The following actions may help to lessen any discomfort you are feeling:  Only take over-the-counter or prescription medicines as directed by your health care provider.  Apply ice to the sore muscle:  Put ice in a plastic bag.  Place a towel between your skin and the bag.  Leave the ice on for 15-20 minutes, 3-4 times a day.  You may alternate applying hot and cold packs to the muscle as directed by your health care provider.  If overuse is causing your muscle pain, slow down your activities until the pain goes away.  Remember that it is normal  to feel some muscle pain after starting a workout program. Muscles that have not been used often will be sore at first.  Do regular, gentle exercises if you are not usually active.  Warm up before exercising to lower your risk of muscle pain.  Do not continue working out if the pain is very bad. Bad pain could mean you have injured a muscle. SEEK MEDICAL CARE IF:  Your muscle pain gets worse, and medicines do not help.  You have muscle pain that lasts longer than 3 days.  You have a rash or fever along with muscle pain.  You have muscle pain after a tick bite.  You have muscle pain while working out, even though you are in good physical condition.  You have redness, soreness, or swelling along with muscle pain.  You have muscle pain after starting a new medicine or changing the dose of a medicine. SEEK IMMEDIATE MEDICAL CARE IF:  You have trouble breathing.  You have trouble swallowing.  You have muscle pain along with a stiff neck, fever, and vomiting.  You have severe muscle weakness or cannot move part of your body. MAKE SURE YOU:   Understand these instructions.  Will watch your condition.  Will get help right away if you are not doing well or get worse.   This information is not intended to replace advice given to you by your health care provider. Make sure you discuss any questions you have with your health care provider.  [employer]  Chronic Pain The [employer] 211 is a great source of information about community services available.  Access by dialing 2-1-1 from anywhere in [place], or by website -  [employer].   Other Local Resources (Updated 07/2015)   Chronic Pain   Services     Phone Number and Address  [facility]  Pain management is offered by board-certified physician specialists, physical therapists, occupational therapists, and other professionals  Support groups  [phone] [address], [place], [place], [place] [address]  [facility]  [facility]  Various pain management strategies are offered, including medication, acupuncture, and manual therapy  Must be referred by primary care doctor [phone] [address], [place]   [facility], [place]  Pain management clinic  Must be referred by primary care doctor [phone] [address], [place], [facility]  Pain management is offered by board-certified physician specialists [phone] [address], [address] [place], [place] [address]    Follow up with your primary care provider if your symptoms do not improve. Take muscles relaxers as needed for pain. Return tot he Emergency Department if you experience severe worsening of your symptoms, loss of consciousness, blurry vision, vomiting, loss of control of your bowel and bladder, numbness/tingling in both of lower extremities.

## 2016-01-04 NOTE — ED Notes (Signed)
Pt stable, ambulatory, states understanding of discharge instructions 

## 2016-01-04 NOTE — ED Notes (Signed)
Pt. slipped and fell at bathtub 4 days ago at home , denies LOC /ambulatory , reports mild intermittent pain at posterior neck and low back pain ,denies dysuria or hematuria .

## 2016-01-04 NOTE — ED Provider Notes (Signed)
CSN: [id number]     Arrival date & time 01/04/16  2131 History  By signing my name below, I, Higinio Plan, attest that this documentation has been prepared under the direction and in the presence of non-physician practitioner, Donnald Garre, PA-C. Electronically Signed: Higinio Plan, Scribe. 01/04/2016. 10:46 PM.   Chief Complaint  Patient presents with  . Fall   The history is provided by the patient. No language interpreter was used.  HPI Comments:  Shannon Clements is a 45 y.o. female with PMHx of HTN and inner ear vertigo, who presents to the Emergency Department complaining of gradually worsening, 7/10, body aches that began ~3 days ago but worsened today. Pt states she lost her balance and slipped in the bathtub ~4 days ago. She notes she fell backwards and struck the back of her neck and landed on her tailbone. Pt states she was able to get up easily and walk afterwards. She denies LOC or hitting her head. 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 She states she has taken ibuprofen, tylenol, and aleve with no relief. She denies tingling or numbness in her legs and bladder or bowel incontinence. She also denies use of blood thinners.   Past Medical History  Diagnosis Date  . Hypertension   . Depression   . Anxiety   . Vertigo   . Headache    Past Surgical History  Procedure Laterality Date  . Small intestine surgery     Family History  Problem Relation Age of Onset  . Cancer Father    Social History  Substance Use Topics  . Smoking status: Current Every Day Smoker -- 0.00 packs/day    Types: Cigarettes  . Smokeless tobacco: Never Used  . Alcohol Use: Yes   OB History    No data available     Review of Systems 10 systems reviewed and all are negative for acute change except as noted in the HPI.  Allergies  Tramadol  Home Medications   Prior to Admission medications   Medication Sig Start Date  End Date Taking? Authorizing Provider  baclofen (LIORESAL) 10 MG tablet Take 1 tablet (10 mg total) by mouth 3 (three) times daily. 08/20/15   Tammy Triplett, PA-C  clonazePAM (KLONOPIN) 0.5 MG tablet Take 1 tablet by mouth daily as needed for anxiety.  05/24/14   Historical Provider, MD  cyclobenzaprine (FLEXERIL) 10 MG tablet Take 1 tablet (10 mg total) by mouth 3 (three) times daily. 09/24/15   Lily Kocher, PA-C  cyclobenzaprine (FLEXERIL) 10 MG tablet Take 1 tablet (10 mg total) by mouth 2 (two) times daily as needed for muscle spasms. 01/04/16   Shawana Knoch Tripp Jozef Eisenbeis, PA-C  diclofenac (VOLTAREN) 75 MG EC tablet Take 1 tablet (75 mg total) by mouth 2 (two) times daily. 09/24/15   Lily Kocher, PA-C  FLUoxetine (PROZAC) 20 MG capsule Take 60 mg by mouth daily.    Historical Provider, MD  HYDROcodone-acetaminophen (NORCO/VICODIN) 5-325 MG tablet Take 1 tablet by mouth every 4 (four) hours as needed. 07/25/15   Evalee Jefferson, PA-C  naproxen (NAPROSYN) 500 MG tablet Take 1 tablet (500 mg total) by mouth 2 (two) times daily. 01/04/16   Lache Dagher Tripp Shilo Philipson, PA-C  naproxen sodium (ANAPROX) 220 MG tablet Take 440 mg by mouth daily as needed (pain).    Historical Provider, MD  ondansetron (ZOFRAN ODT) 8 MG disintegrating tablet Take 1 tablet (8 mg total)  by mouth every 8 (eight) hours as needed for nausea or vomiting. 07/25/15   Evalee Jefferson, PA-C  QUEtiapine (SEROQUEL) 300 MG tablet Take 600 mg by mouth at bedtime. 01/05/15   Historical Provider, MD   There were no vitals taken for this visit. Physical Exam  Constitutional: She is oriented to person, place, and time. She appears well-developed and well-nourished. No distress.  HENT:  Head: Normocephalic and atraumatic.  Eyes: Conjunctivae are normal. Right eye exhibits no discharge. Left eye exhibits no discharge. No scleral icterus.  Cardiovascular: Normal rate.   Pulmonary/Chest: Effort normal.  Musculoskeletal:  No midline spinal tenderness. Mild TTP of  left cervical and lumbar paraspinal muscle tenderness. FROM of C, T, L spine. No step offs or obvious bony deformities.   Neurological: She is alert and oriented to person, place, and time. Coordination normal.  Strength 5/5 throughout. No sensory deficits.  No gait abnormality.  Skin: Skin is warm and dry. No rash noted. She is not diaphoretic. No erythema. No pallor.  Psychiatric: She has a normal mood and affect. Her behavior is normal.  Nursing note and vitals reviewed.  ED Course  Procedures  DIAGNOSTIC STUDIES:  Oxygen Saturation is 100% on RA, normal by my interpretation.    COORDINATION OF CARE:  10:41 PM Discussed treatment plan, which includes muscle relaxers with pt at bedside and pt agreed to plan.  Labs Review Labs Reviewed - No data to display  Imaging Review No results found. I have personally reviewed and evaluated these images and lab results as part of my medical decision-making.  MDM   Final diagnoses:  Muscle pain    Pt presents to the ED c/o muscle soreness after falling in the bathtub 4 days ago. Pt appears well in ED, no neuro deficits on exam. Ambulatory in the ED without difficulty. No midline spinal tenderness.  No loss of bowel or bladder control.  No concern for cauda equina.  No fever, night sweats, weight loss, h/o cancer, IVDU.  Pt continuously asking for Norco saying that is the only thing that treats her pain. Pt now stating that she has pain all the time and has been ongoing for several months. Pt given resources for pain management for chronic pain. Rx for Flexeril and NAprosyn given. RICE protocol and pain medicine indicated and discussed with patient.    I personally performed the services described in this documentation, which was scribed in my presence. The recorded information has been reviewed and is accurate.     Dondra Spry [place], PA-C 01/06/16 [id number]  Julianne Rice, MD 01/12/16 [id number]

## 2016-06-11 IMAGING — DX DG SACRUM/COCCYX 2+V
3 series · 3 of 3 positions shown · non-contrast
Comparison: 07/06/2015

CLINICAL DATA: Low back pain.  Fall 2-3 weeks ago.

EXAM:
SACRUM AND COCCYX - 2+ VIEW

[sacrum ap]
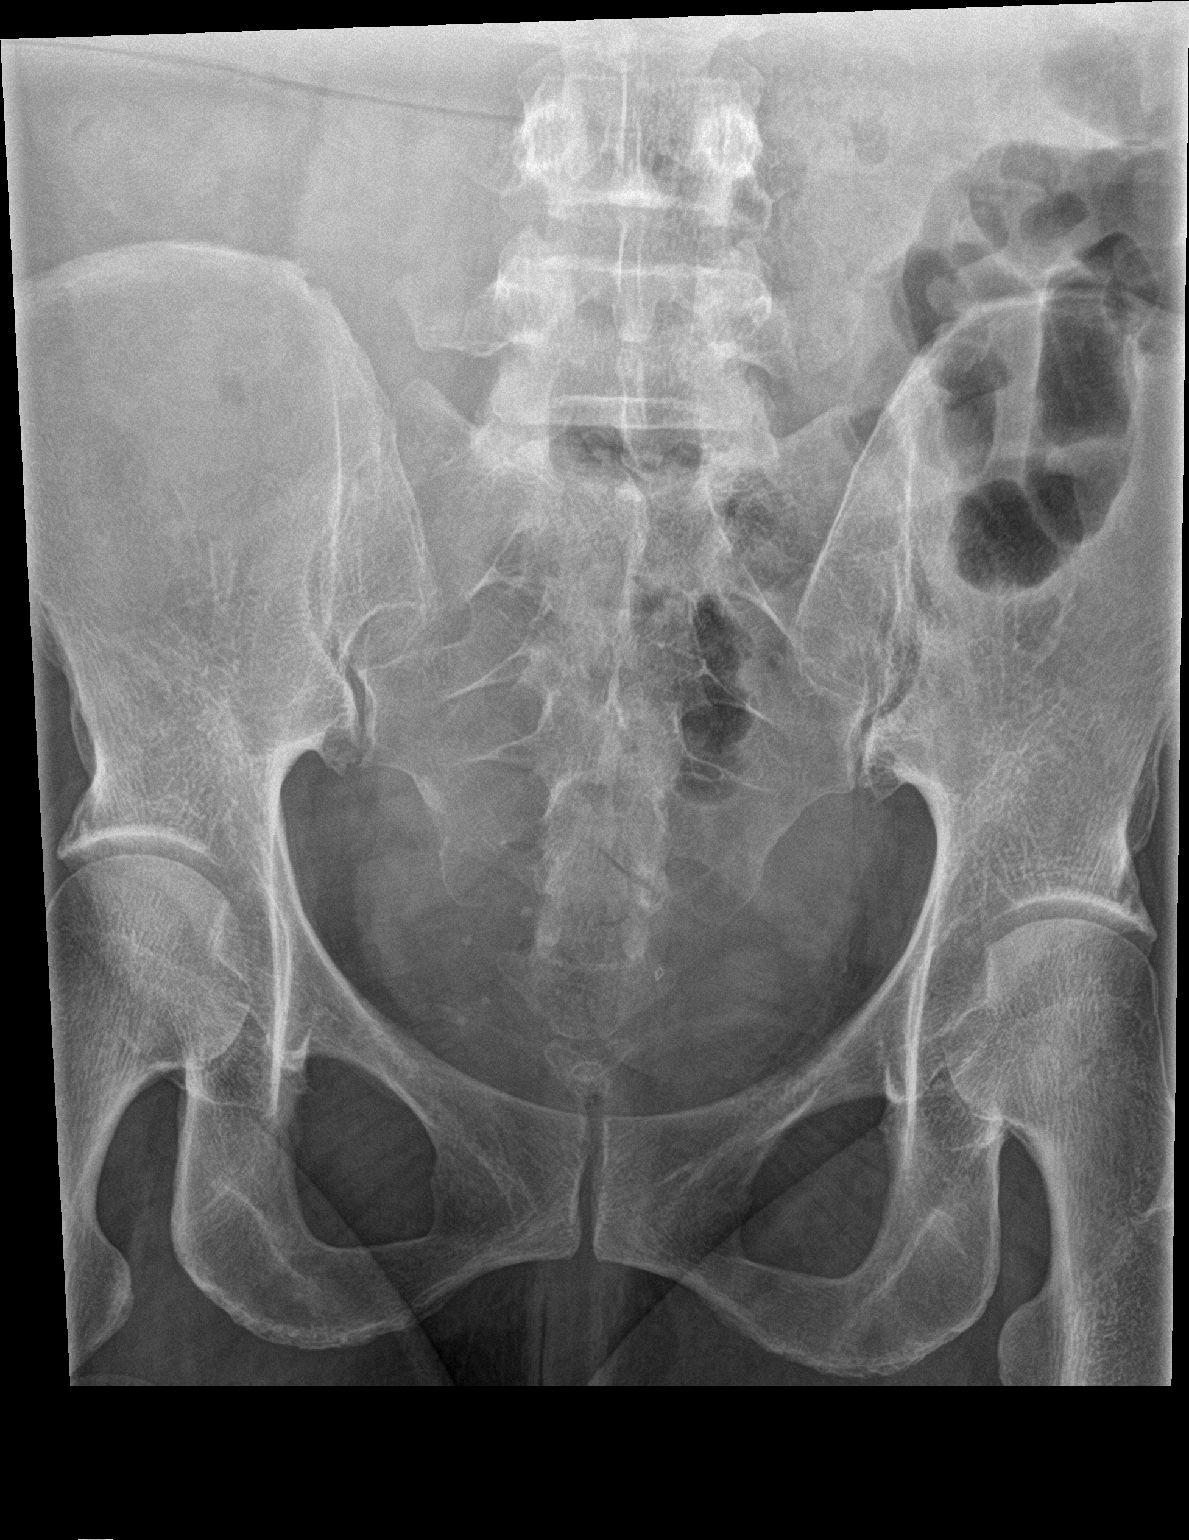

[sacrum lat]
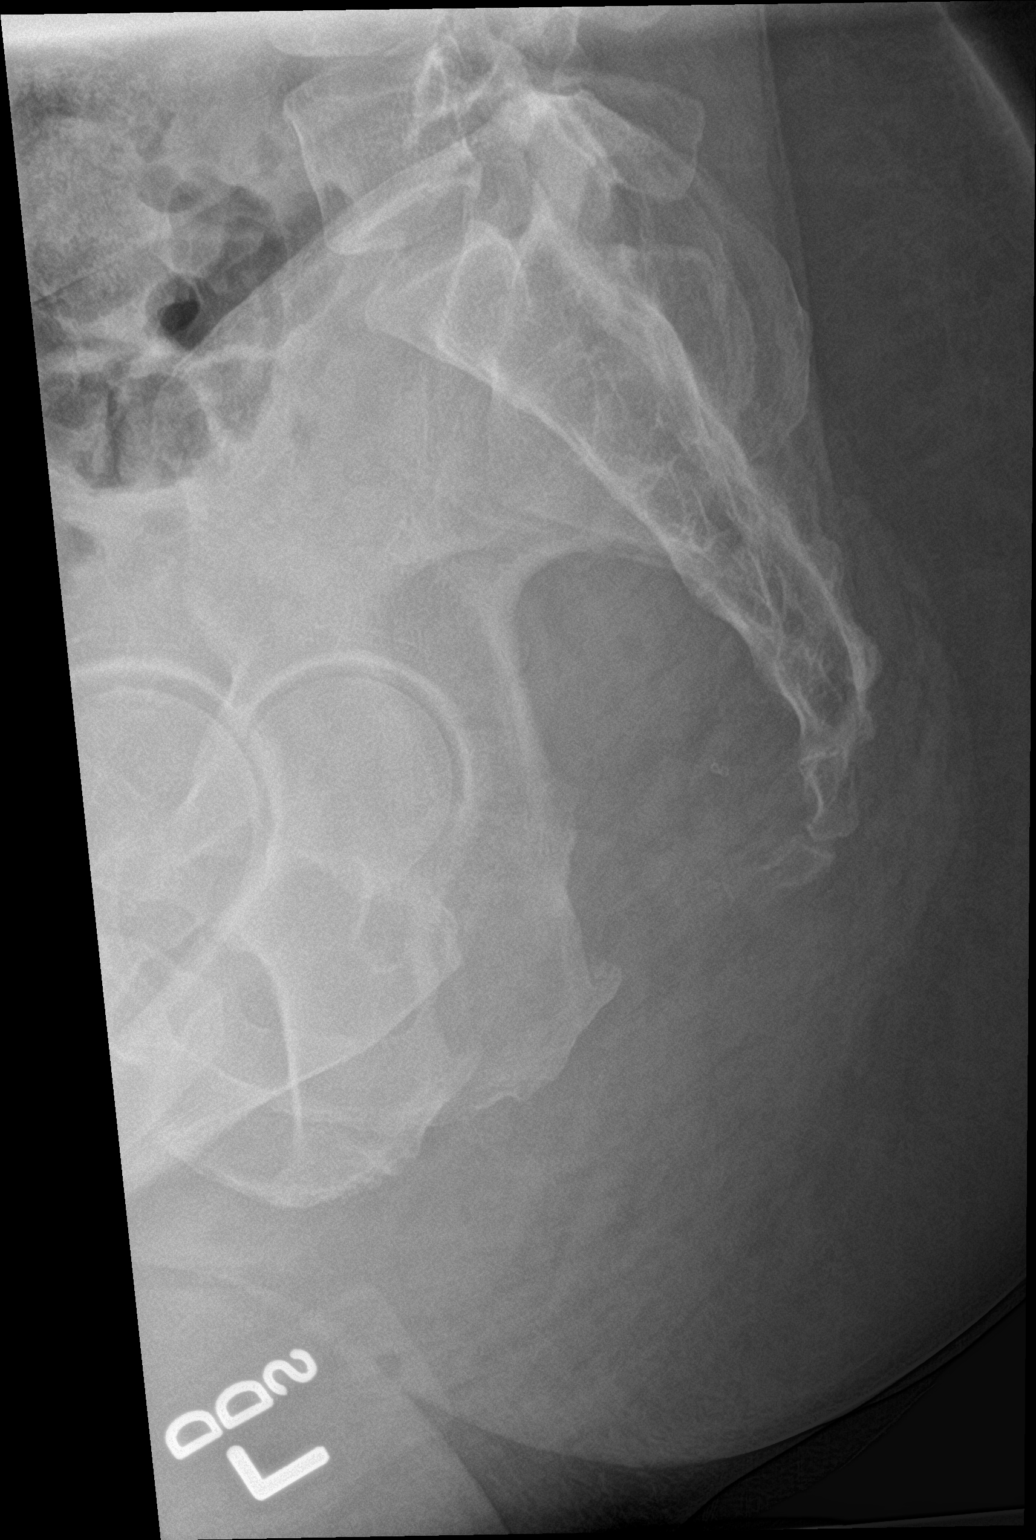

[coccyx ap]
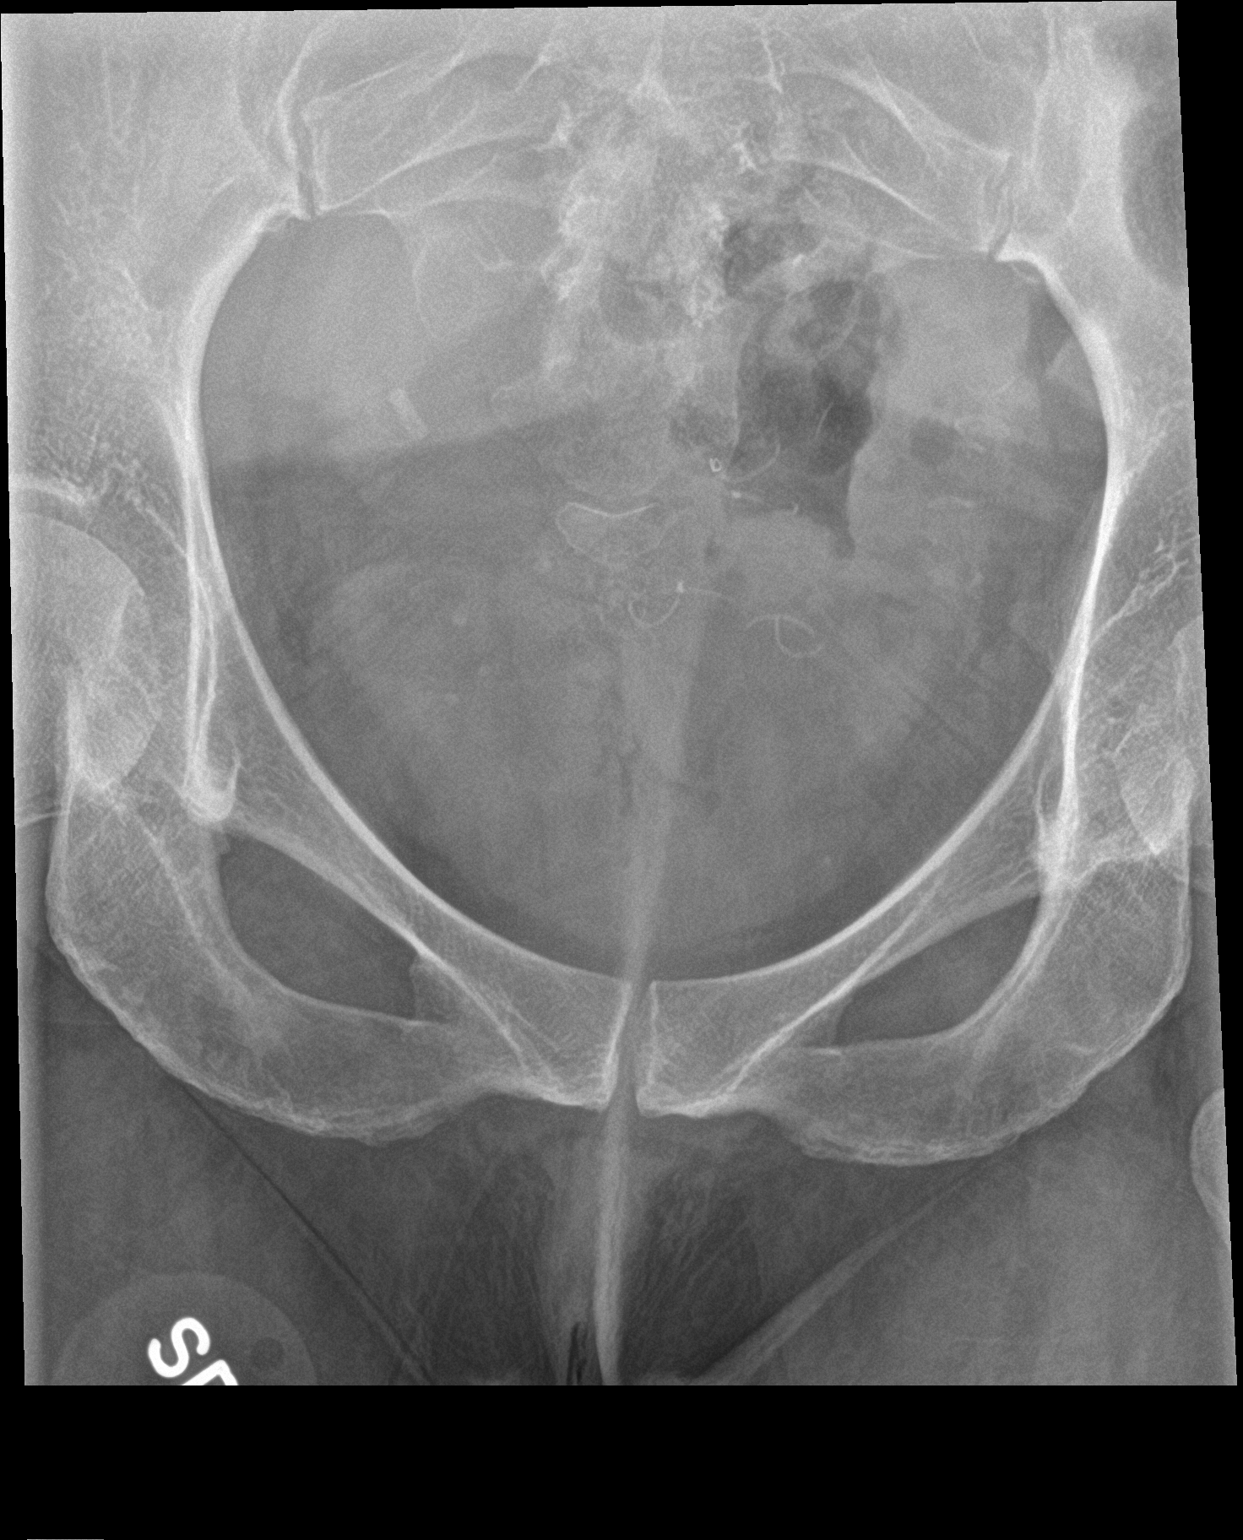

[3 of 3 positions shown; findings below may reference images not displayed]

FINDINGS: There is no evidence of fracture or diastasis.

Stable appearance of presumed suture or other surgical implement
near the rectum.
IMPRESSION: Negative for fracture or diastasis.

## 2016-06-11 IMAGING — DX DG LUMBAR SPINE COMPLETE 4+V
5 series · 5 of 5 positions shown · non-contrast
Comparison: 04/05/2015

CLINICAL DATA: Fell approximately 3 weeks ago. Low back pain
radiating to both legs. Initial encounter.

EXAM:
LUMBAR SPINE - COMPLETE 4+ VIEW

[l-spine obl (1 of 2)]
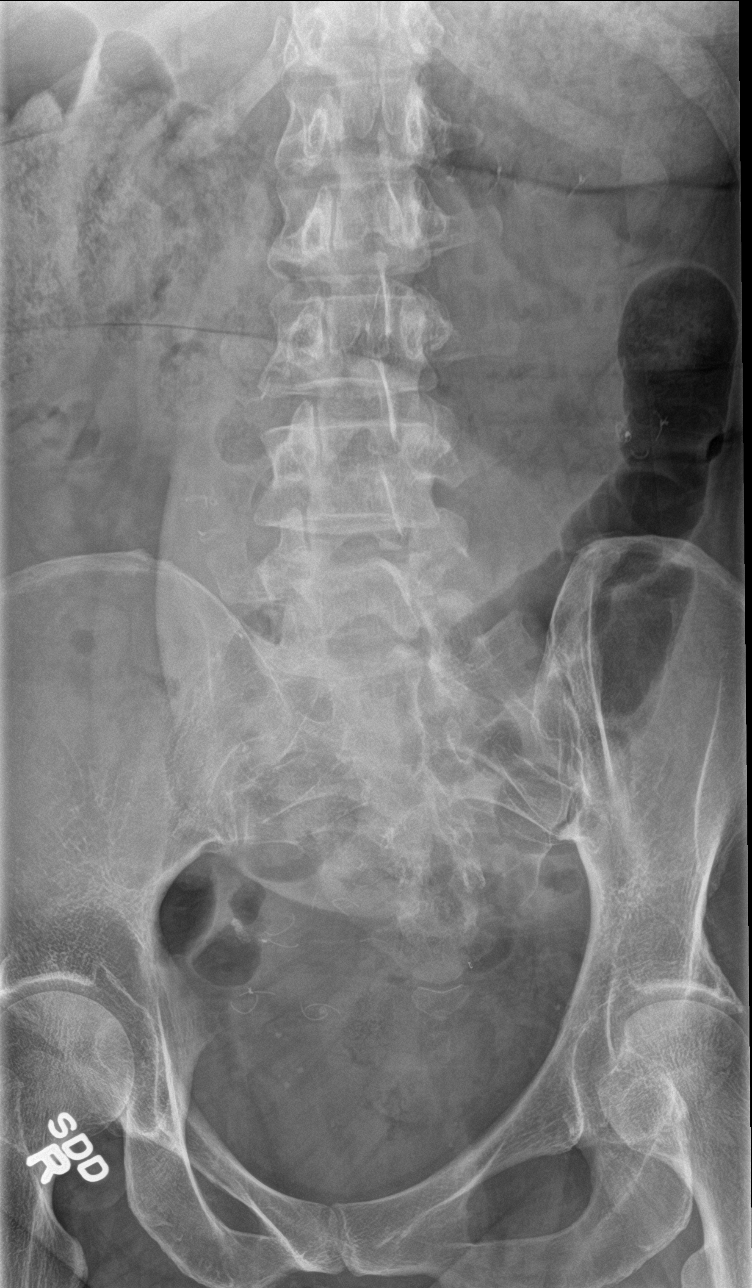

[l-spine obl (2 of 2)]
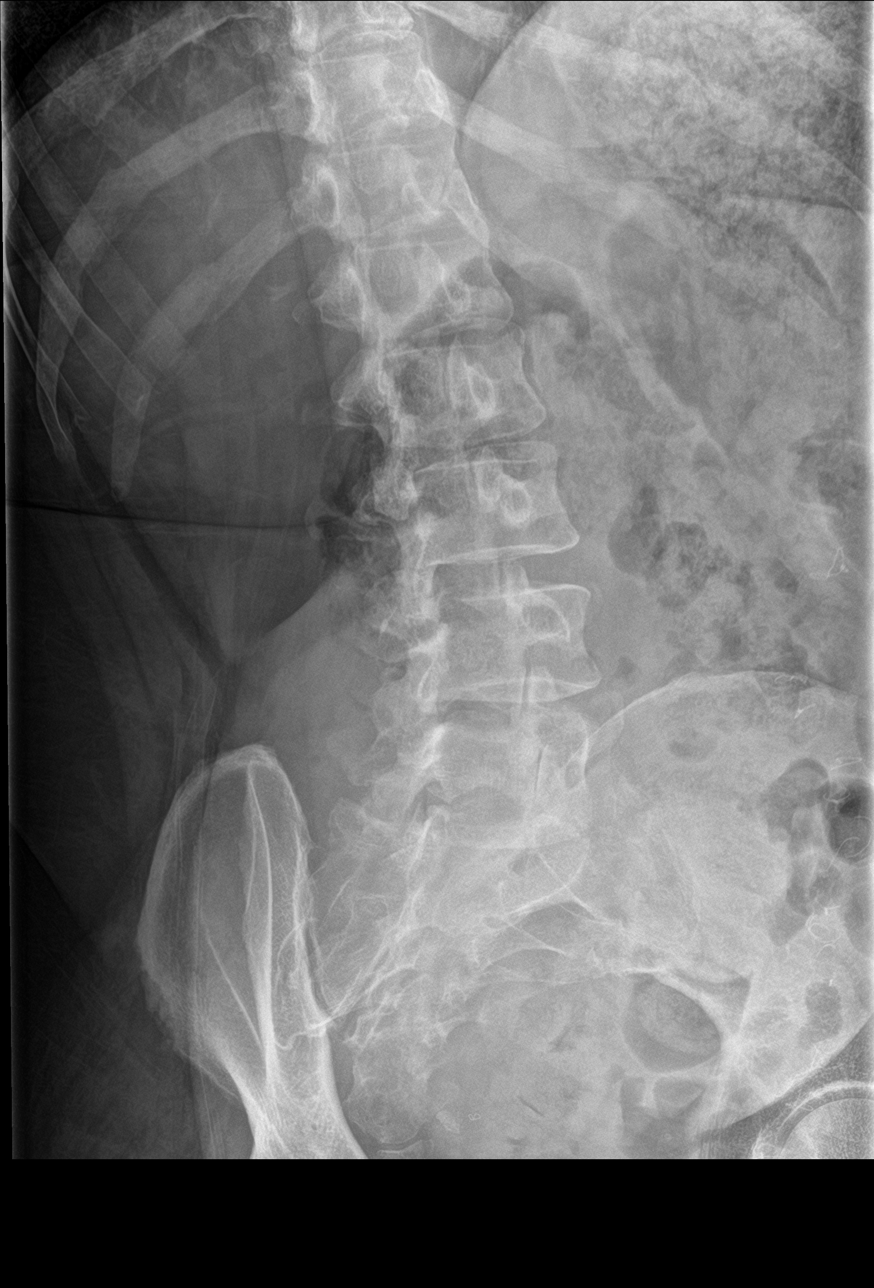

[l-spine lat]
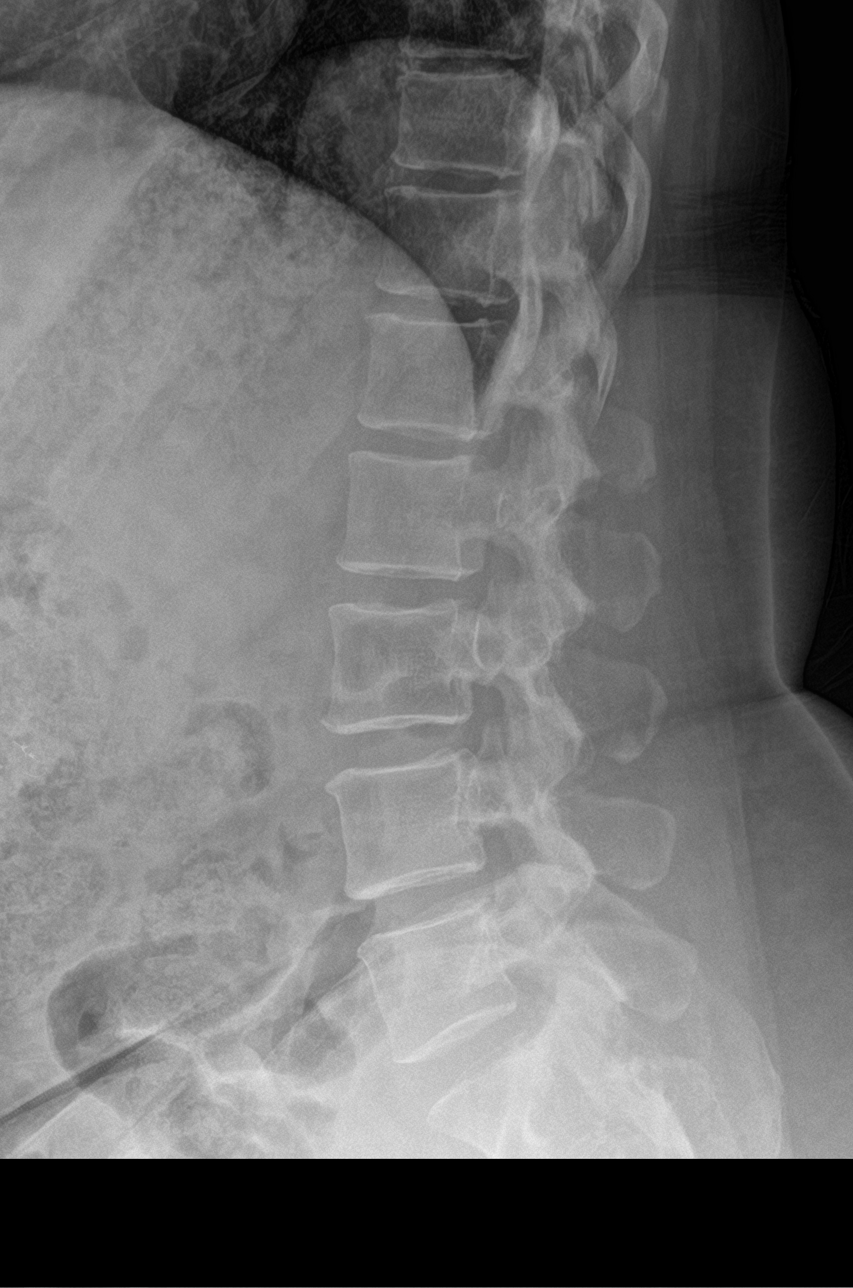

[l-spine spot]
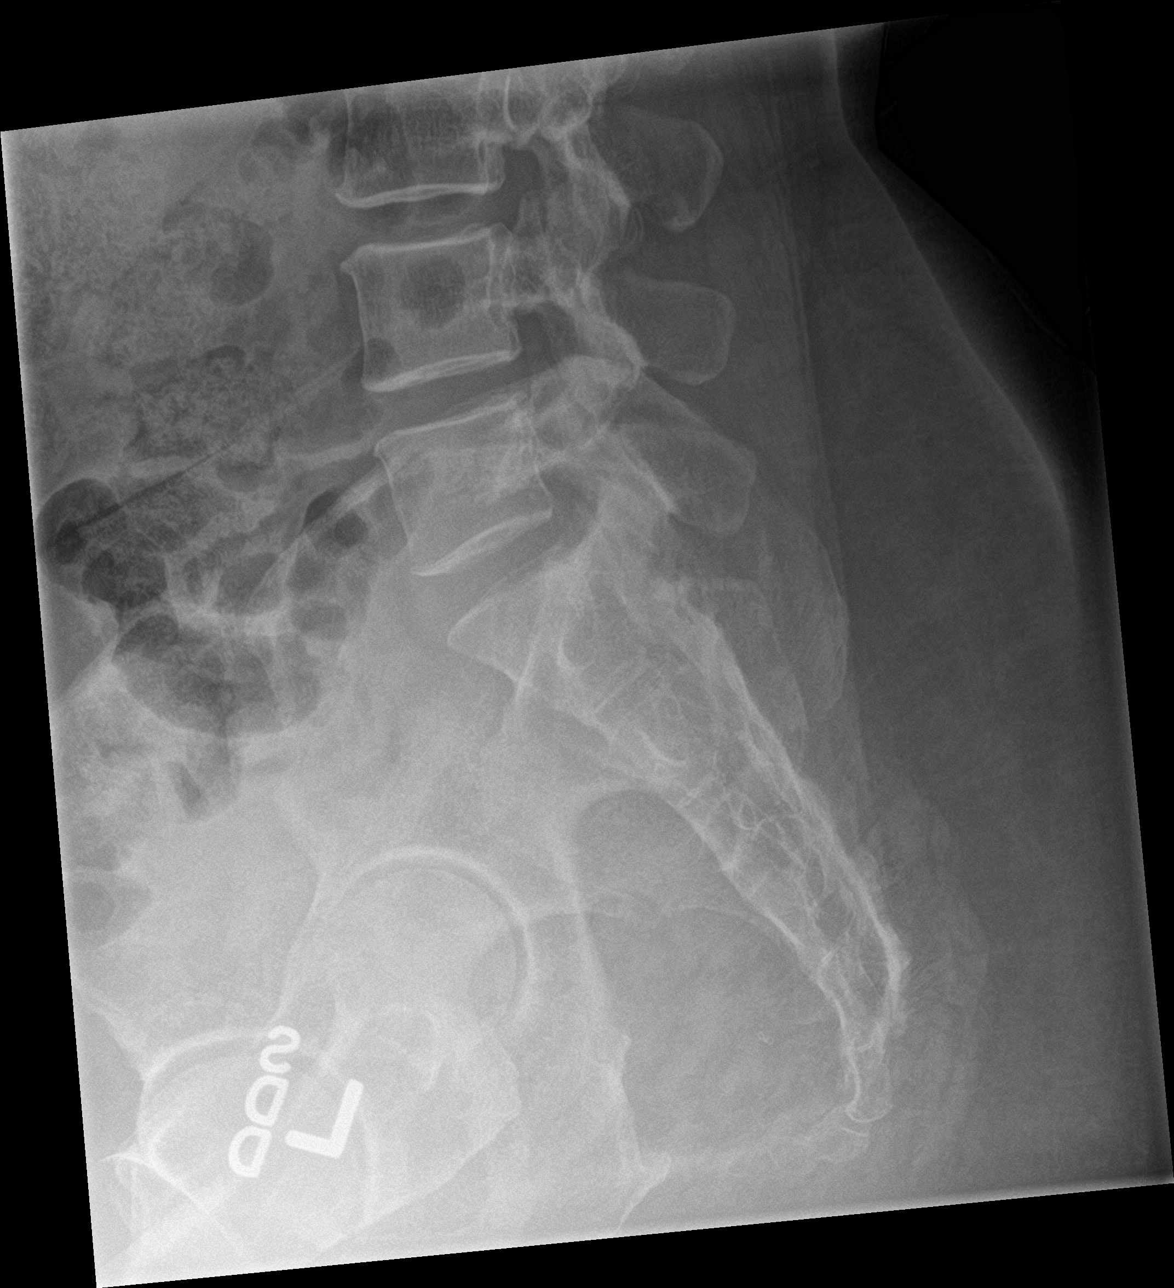

[l-spine ap]
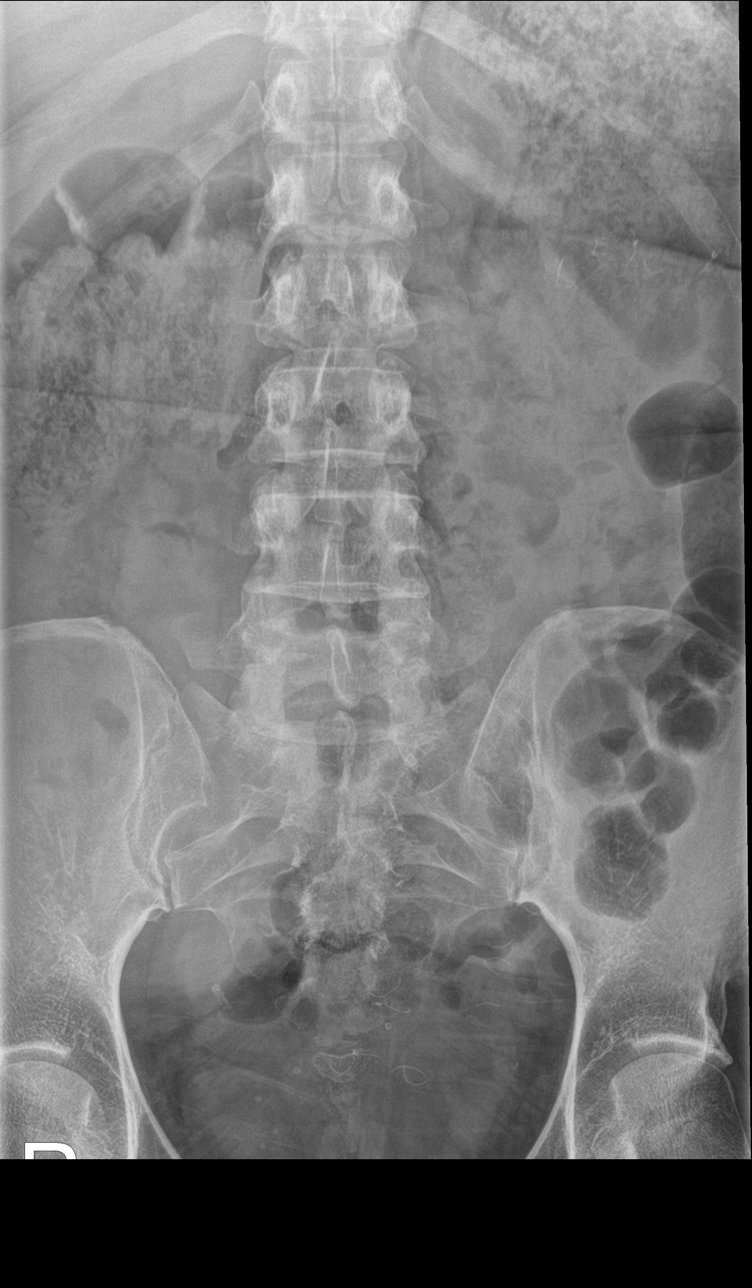

[5 of 5 positions shown; findings below may reference images not displayed]

FINDINGS: There is no evidence of lumbar spine fracture. Alignment is normal.
Intervertebral disc spaces are maintained. No lytic or sclerotic
bone lesions identified. Incidental note is made of spina bifida
occulta in the lower thoracic spine, which is a normal variant.
IMPRESSION: Negative.

## 2017-03-31 ENCOUNTER — Other Ambulatory Visit: Payer: Self-pay | Admitting: Oncology

## 2017-04-17 ENCOUNTER — Other Ambulatory Visit (HOSPITAL_COMMUNITY): Payer: Self-pay | Admitting: Radiation Oncology

## 2017-04-17 DIAGNOSIS — C119 Malignant neoplasm of nasopharynx, unspecified: Secondary | ICD-10-CM

## 2017-04-29 ENCOUNTER — Other Ambulatory Visit: Payer: Self-pay | Admitting: Oncology

## 2017-05-15 ENCOUNTER — Ambulatory Visit (INDEPENDENT_AMBULATORY_CARE_PROVIDER_SITE_OTHER): Payer: Medicare Other | Admitting: Otolaryngology

## 2017-05-15 DIAGNOSIS — C119 Malignant neoplasm of nasopharynx, unspecified: Secondary | ICD-10-CM

## 2017-05-15 DIAGNOSIS — R1312 Dysphagia, oropharyngeal phase: Secondary | ICD-10-CM

## 2017-05-29 ENCOUNTER — Other Ambulatory Visit: Payer: Self-pay | Admitting: Oncology

## 2017-06-02 ENCOUNTER — Other Ambulatory Visit: Payer: Self-pay | Admitting: Oncology

## 2017-06-09 ENCOUNTER — Ambulatory Visit (HOSPITAL_COMMUNITY): Payer: Medicare Other

## 2017-06-30 ENCOUNTER — Encounter (HOSPITAL_COMMUNITY): Payer: Self-pay

## 2017-06-30 ENCOUNTER — Encounter (HOSPITAL_COMMUNITY): Payer: Medicare Other

## 2017-07-07 ENCOUNTER — Encounter (HOSPITAL_COMMUNITY): Payer: Medicare Other

## 2017-08-22 DEATH — deceased
